# Patient Record
Sex: Male | Born: 1940 | Race: White | Hispanic: No | Marital: Married | Smoking: Never smoker
Health system: Southern US, Community
[De-identification: ages and names within clinical notes are randomized; demographics above are authoritative.]

## PROBLEM LIST (undated history)

## (undated) DIAGNOSIS — M419 Scoliosis, unspecified: Secondary | ICD-10-CM

## (undated) DIAGNOSIS — K141 Geographic tongue: Secondary | ICD-10-CM

## (undated) DIAGNOSIS — I10 Essential (primary) hypertension: Secondary | ICD-10-CM

## (undated) DIAGNOSIS — E785 Hyperlipidemia, unspecified: Secondary | ICD-10-CM

## (undated) DIAGNOSIS — G8929 Other chronic pain: Secondary | ICD-10-CM

## (undated) DIAGNOSIS — M25562 Pain in left knee: Secondary | ICD-10-CM

## (undated) DIAGNOSIS — I700 Atherosclerosis of aorta: Secondary | ICD-10-CM

## (undated) DIAGNOSIS — G2581 Restless legs syndrome: Secondary | ICD-10-CM

## (undated) DIAGNOSIS — Z01810 Encounter for preprocedural cardiovascular examination: Secondary | ICD-10-CM

## (undated) DIAGNOSIS — M25561 Pain in right knee: Secondary | ICD-10-CM

## (undated) DIAGNOSIS — M1990 Unspecified osteoarthritis, unspecified site: Secondary | ICD-10-CM

## (undated) DIAGNOSIS — E1169 Type 2 diabetes mellitus with other specified complication: Secondary | ICD-10-CM

## (undated) DIAGNOSIS — Z889 Allergy status to unspecified drugs, medicaments and biological substances status: Secondary | ICD-10-CM

## (undated) DIAGNOSIS — Z87442 Personal history of urinary calculi: Secondary | ICD-10-CM

## (undated) DIAGNOSIS — K5792 Diverticulitis of intestine, part unspecified, without perforation or abscess without bleeding: Secondary | ICD-10-CM

## (undated) DIAGNOSIS — R011 Cardiac murmur, unspecified: Secondary | ICD-10-CM

## (undated) DIAGNOSIS — M4807 Spinal stenosis, lumbosacral region: Secondary | ICD-10-CM

## (undated) HISTORY — PX: APPENDECTOMY: SHX54

## (undated) HISTORY — DX: Hyperlipidemia, unspecified: E78.5

## (undated) HISTORY — PX: COLONOSCOPY: SHX174

## (undated) HISTORY — PX: KNEE ARTHROSCOPY: SHX127

## (undated) HISTORY — DX: Essential (primary) hypertension: I10

## (undated) HISTORY — PX: ROTATOR CUFF REPAIR: SHX139

## (undated) HISTORY — DX: Pain in right knee: M25.561

## (undated) HISTORY — DX: Other chronic pain: G89.29

## (undated) HISTORY — DX: Type 2 diabetes mellitus with other specified complication: E11.69

## (undated) HISTORY — DX: Unspecified osteoarthritis, unspecified site: M19.90

## (undated) HISTORY — DX: Morbid (severe) obesity due to excess calories: E66.01

## (undated) HISTORY — DX: Encounter for preprocedural cardiovascular examination: Z01.810

## (undated) HISTORY — DX: Atherosclerosis of aorta: I70.0

## (undated) HISTORY — PX: SHOULDER SURGERY: SHX246

## (undated) HISTORY — PX: CHOLECYSTECTOMY: SHX55

## (undated) HISTORY — PX: CYSTOSCOPY: SUR368

## (undated) HISTORY — PX: TONSILLECTOMY: SUR1361

## (undated) HISTORY — PX: OTHER SURGICAL HISTORY: SHX169

## (undated) HISTORY — DX: Pain in left knee: M25.562

---

## 1998-02-10 ENCOUNTER — Ambulatory Visit (HOSPITAL_COMMUNITY): Admission: RE | Admit: 1998-02-10 | Discharge: 1998-02-10 | Payer: Self-pay | Admitting: Gastroenterology

## 1998-02-10 ENCOUNTER — Encounter: Payer: Self-pay | Admitting: Gastroenterology

## 1998-10-13 ENCOUNTER — Encounter (INDEPENDENT_AMBULATORY_CARE_PROVIDER_SITE_OTHER): Payer: Self-pay | Admitting: Specialist

## 1998-10-13 ENCOUNTER — Observation Stay (HOSPITAL_COMMUNITY): Admission: RE | Admit: 1998-10-13 | Discharge: 1998-10-14 | Payer: Self-pay | Admitting: General Surgery

## 1998-10-13 ENCOUNTER — Encounter: Payer: Self-pay | Admitting: General Surgery

## 1998-11-27 ENCOUNTER — Encounter: Payer: Self-pay | Admitting: Emergency Medicine

## 1998-11-27 ENCOUNTER — Inpatient Hospital Stay (HOSPITAL_COMMUNITY): Admission: EM | Admit: 1998-11-27 | Discharge: 1998-12-07 | Payer: Self-pay | Admitting: Emergency Medicine

## 1998-11-30 ENCOUNTER — Encounter: Payer: Self-pay | Admitting: Surgery

## 1998-12-06 ENCOUNTER — Encounter: Payer: Self-pay | Admitting: Surgery

## 1999-01-05 ENCOUNTER — Encounter: Payer: Self-pay | Admitting: Surgery

## 1999-01-05 ENCOUNTER — Encounter: Admission: RE | Admit: 1999-01-05 | Discharge: 1999-01-05 | Payer: Self-pay | Admitting: Surgery

## 2003-02-20 ENCOUNTER — Ambulatory Visit (HOSPITAL_BASED_OUTPATIENT_CLINIC_OR_DEPARTMENT_OTHER): Admission: RE | Admit: 2003-02-20 | Discharge: 2003-02-20 | Payer: Self-pay | Admitting: Internal Medicine

## 2005-11-28 ENCOUNTER — Encounter: Admission: RE | Admit: 2005-11-28 | Discharge: 2005-11-28 | Payer: Self-pay | Admitting: Gastroenterology

## 2005-12-29 ENCOUNTER — Encounter: Admission: RE | Admit: 2005-12-29 | Discharge: 2005-12-29 | Payer: Self-pay | Admitting: Surgery

## 2009-03-09 ENCOUNTER — Encounter: Admission: RE | Admit: 2009-03-09 | Discharge: 2009-03-09 | Payer: Self-pay | Admitting: Internal Medicine

## 2009-03-11 ENCOUNTER — Ambulatory Visit: Payer: Self-pay | Admitting: Vascular Surgery

## 2009-03-23 ENCOUNTER — Ambulatory Visit (HOSPITAL_COMMUNITY): Admission: RE | Admit: 2009-03-23 | Discharge: 2009-03-23 | Payer: Self-pay | Admitting: Neurological Surgery

## 2009-04-21 ENCOUNTER — Encounter: Admission: RE | Admit: 2009-04-21 | Discharge: 2009-04-21 | Payer: Self-pay | Admitting: Neurological Surgery

## 2009-05-04 ENCOUNTER — Ambulatory Visit (HOSPITAL_COMMUNITY): Admission: RE | Admit: 2009-05-04 | Discharge: 2009-05-04 | Payer: Self-pay | Admitting: Neurological Surgery

## 2010-01-23 ENCOUNTER — Encounter: Payer: Self-pay | Admitting: Internal Medicine

## 2010-01-23 ENCOUNTER — Encounter: Payer: Self-pay | Admitting: Neurological Surgery

## 2010-05-17 NOTE — Procedures (Signed)
CAROTID DUPLEX EXAM   INDICATION:  Syncope/vertigo, status post fall.   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  Amaurosis Fugax No, Paresthesias No, Hemiparesis No                                       RIGHT             LEFT  Brachial systolic pressure:         162               160  Brachial Doppler waveforms:         Normal            Normal  Vertebral direction of flow:        Antegrade         Antegrade  DUPLEX VELOCITIES (cm/sec)  CCA peak systolic                   92                83  ECA peak systolic                   129               156  ICA peak systolic                   95                54  ICA end diastolic                   17                17  PLAQUE MORPHOLOGY:                  Mixed             Mixed  PLAQUE AMOUNT:                      Mild              Mild  PLAQUE LOCATION:                    ICA               ICA   IMPRESSION:  1. A 1% to 39% stenosis of the bilateral internal carotid arteries.  2. A preliminary report was faxed to Dr. Lanell Matar office on      03/11/2009.         ___________________________________________  Di Kindle. Edilia Bo, M.D.   CH/MEDQ  D:  03/15/2009  T:  03/15/2009  Job:  [id number]

## 2010-05-20 NOTE — H&P (Signed)
[place]  Patient:    Victor [name]                     MRN: [id number] Adm. Date:  [id number] Attending:  Bonnetta Barry CC:         Florencia Reasons, M.D.                         History and Physical  REASON FOR ADMISSION:  Acute diverticulitis.  ADMISSION HISTORY:  The patient is a 70 year old white male who presents to the  Emergency Department of [facility] with three-day history of lower abdominal pain associated with fever.  The patient had recently undergone laparoscopic cholecystectomy.  He had done well and returned to work.  Over the  past two weeks he had noted intermittent lower abdominal pain.  Over the past three days prior to admission the pain had become significantly more severe and localized to the lower midline and left lower quadrant.  The patient had noted progressive constipation.  He presents to the emergency room for evaluation.  The patient was seen by the emergency room physician, laboratory studies were obtained.  This showed an elevated white blood cell count of 19,000 with a left shift of 91% neutrophils.  CT scan of the abdomen was obtained and demonstrates sigmoid diverticulitis without abscess.  There was a small amount of extraluminal air in the peritoneal cavity.  General surgery was consulted for evaluation and management.  PAST MEDICAL HISTORY:  Status post bilateral rotator cuff repairs, status post laparoscopic cholecystectomy, history of degenerative disk disease of the lumbar spine.  CURRENT MEDICATIONS:  Vioxx 25 mg q.d.  ALLERGIES:  None known.  SOCIAL HISTORY:  The patient works in an Warehouse manager.  He does ot smoke.  He does not drink alcohol.  He denies illicit drug use.  FAMILY HISTORY:  Notable for coronary artery disease in the patients mother and  colon cancer in the patients father.  REVIEW OF SYSTEMS:  A 15-point Systems Review is otherwise  unremarkable except s noted above.  PHYSICAL EXAMINATION:  VITAL SIGNS:  Temperature of 101.2, pulse 94, blood pressure 110/75, respirations 18, O2 saturation 96%.  GENERAL APPEARANCE:  A 70 year old well-developed, well-nourished white male on a stretcher in the emergency department.  HEENT:  Normocephalic and atraumatic.  Sclerae are clear.  Mucous membranes are  moist.  NECK:  Supple without masses.  CHEST:  Clear to auscultation bilaterally.  There is no costovertebral angle tenderness.  CARDIOVASCULAR:  Regular rate and rhythm without murmur.  Peripheral pulses are  full.  ABDOMEN:  Moderately distended.  There are healing surgical wounds at the umbilicus and along the right costal margin.  On palpation there is tenderness mainly in he lower quadrants, but especially in the suprapubic and left lower quadrant. There is mild tenderness to percussion in the left lower quadrant.  There is mild rebound tenderness in the left lower quadrant.  EXTREMITIES:  Nontender without edema.  NEUROLOGIC:  The patient is alert and oriented to person, place, and time without focal neurologic deficit.  GENITOURINARY:  Normal male.  LABORATORY AND X-RAY DATA:  CBC shows white count 19.7, hemoglobin 15.2, hematocrit 44.1, platelet count 253,000.  Differential shows 91% neutrophils, 4% lymphocytes, 4% monocytes.  Chemistry profile is notable for the following abnormalities: Total bilirubin 1.4.  Urinalysis is benign.  Abdominal CT scan:  As described above.  IMPRESSION:  Acute diverticulitis.  PLAN:  The patient will be admitted to my service at [facility].  This is his first episode of acute diverticulitis.  He will be placed at bowel rest with intravenous hydration and started on intravenous antibiotics with Unasyn.  We will assess his progress over the next 24-48 hours and advance his diet after that interval.  I discussed the possibility of complications  including bleeding, perforation, or abscess with the patient and his wife.  I also discussed the possibility of need for sigmoid resection in the future.  They understand. DD:  11/27/98 TD:  11/27/98 Job: [id number]

## 2010-12-07 IMAGING — RF DG MYELOGRAM LUMBAR
11 series · 11 of 11 positions shown · non-contrast
Comparison: None.

05/05/2009 - DUPLICATE COPY for exam association in [facility] – No change from original report.
CLINICAL DATA: Back pain.  
 LUMBAR MYELOGRAM AND POST MYELOGRAM CT

 MYELOGRAM LUMBAR
TECHNIQUE: Contrast was injected by Dr.Dr. Kenichiro at the L1-2
 level. Contrast was negotiated into the lumbar spine without
 difficulty.
 Fluoroscopy time: 1.4 minutes
TECHNIQUE: Multidetector CT imaging of the lumbar spine was
 performed following myelography. Multiplanar CT image
 reconstructions were also generated.

[Series 1: run · 1 of 1 slices shown (1 of 11)]
[im 1/1]
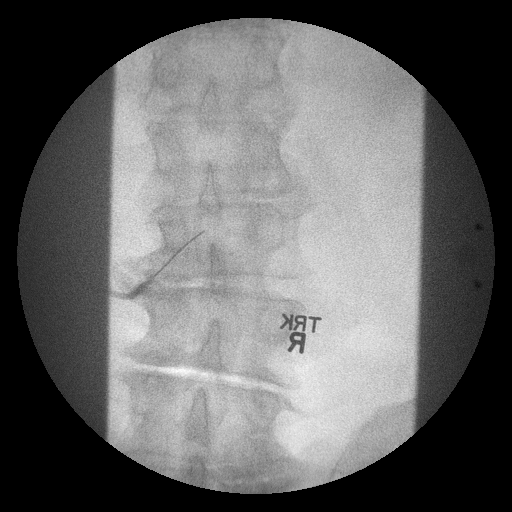

[Series 2: run · 1 of 1 slices shown (2 of 11)]
[im 1/1]
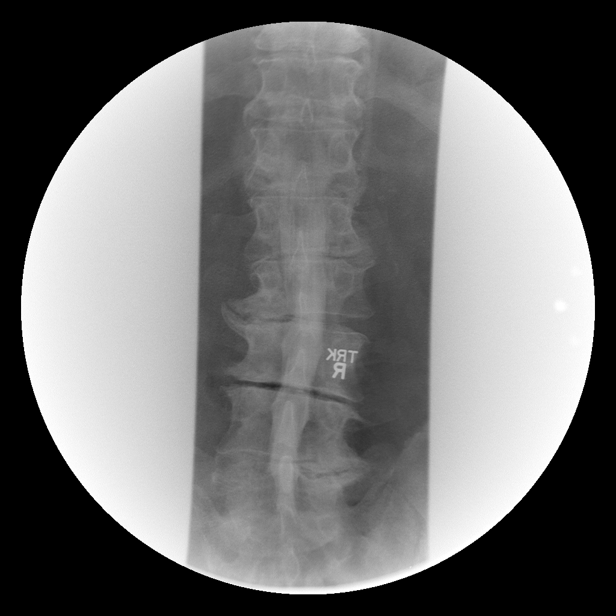

[Series 3: run · 1 of 1 slices shown (3 of 11)]
[im 1/1]
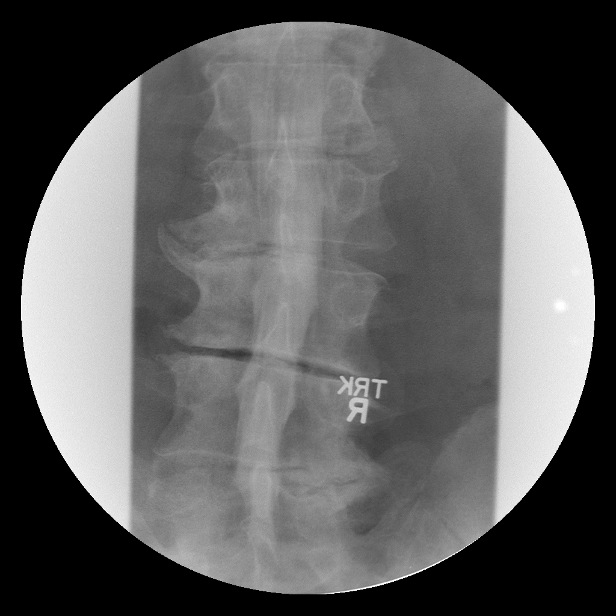

[Series 4: run · 1 of 1 slices shown (4 of 11)]
[im 1/1]
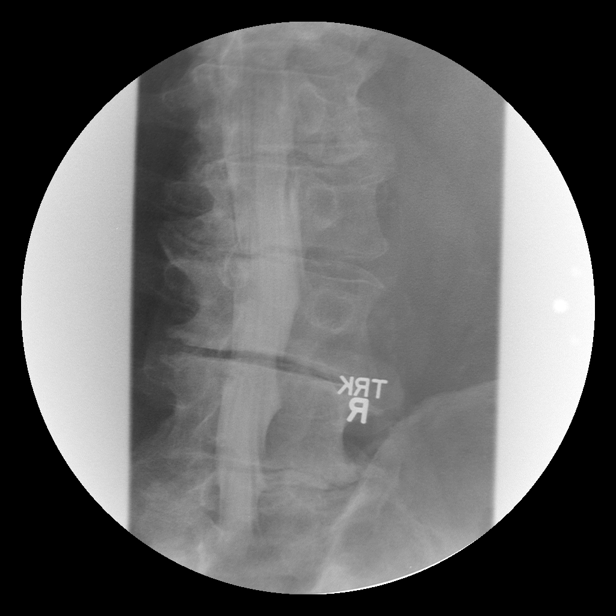

[Series 5: run · 1 of 1 slices shown (5 of 11)]
[im 1/1]
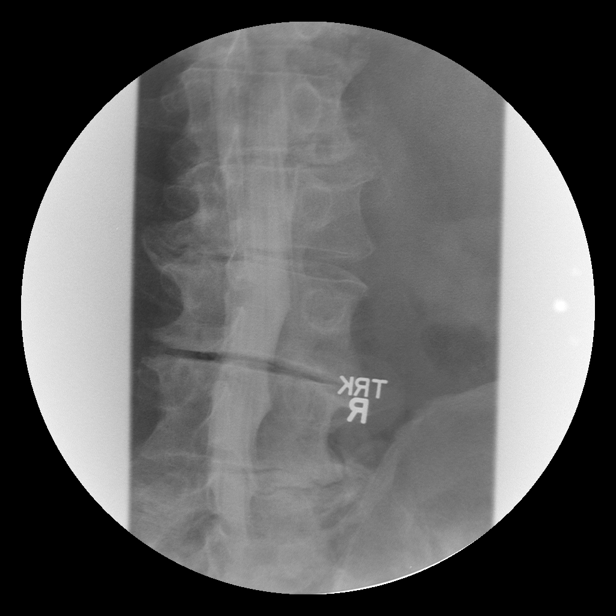

[Series 6: run · 1 of 1 slices shown (6 of 11)]
[im 1/1]
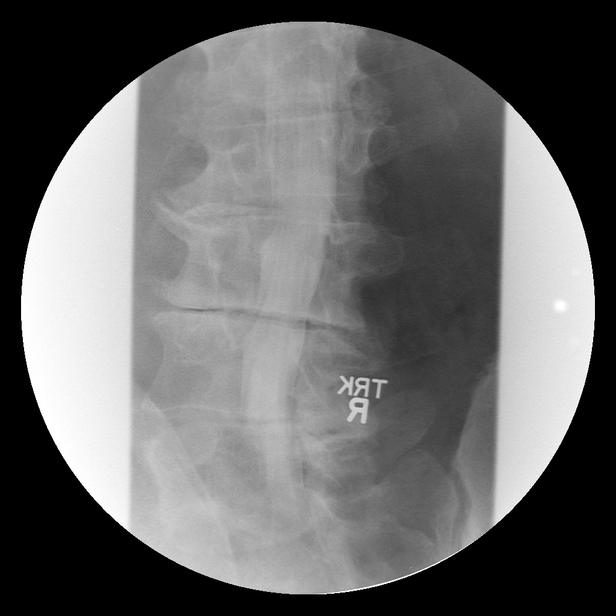

[Series 7: run · 1 of 1 slices shown (7 of 11)]
[im 1/1]
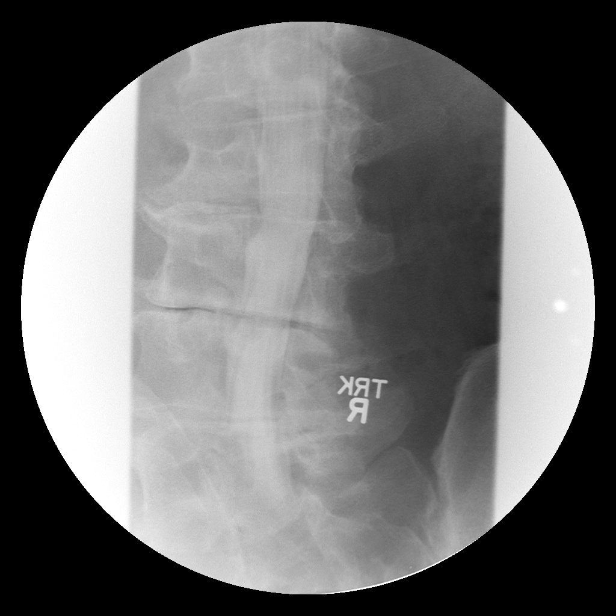

[Series 8: run · 1 of 1 slices shown (8 of 11)]
[im 1/1]
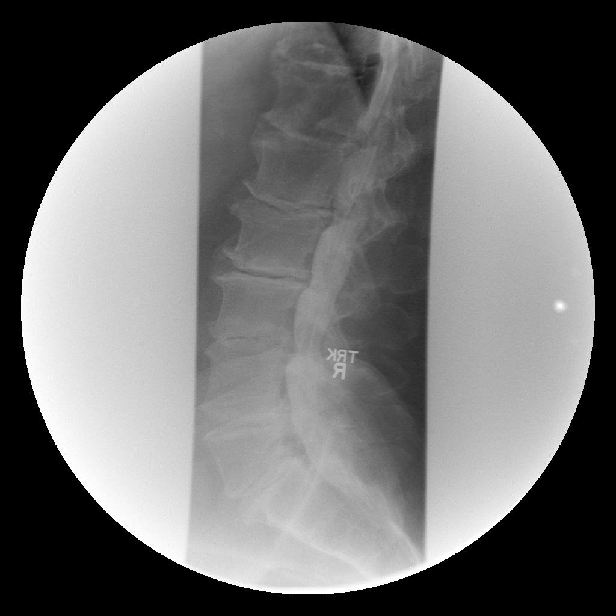

[Series 9: run · 1 of 1 slices shown (9 of 11)]
[im 1/1]
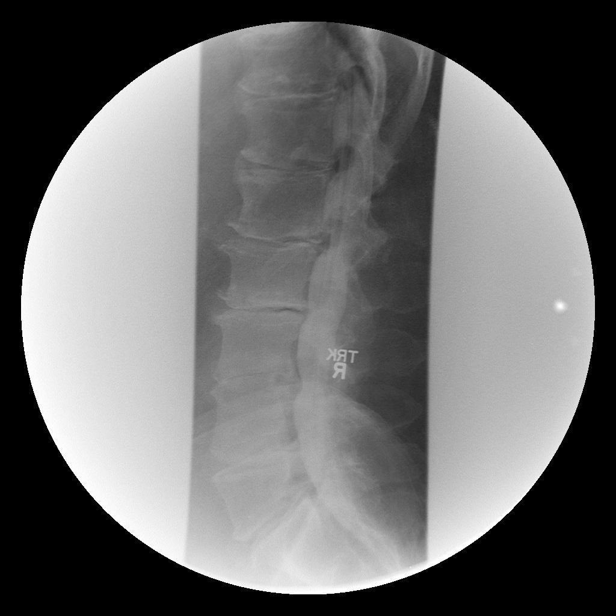

[Series 10: run · 1 of 1 slices shown (10 of 11)]
[im 1/1]
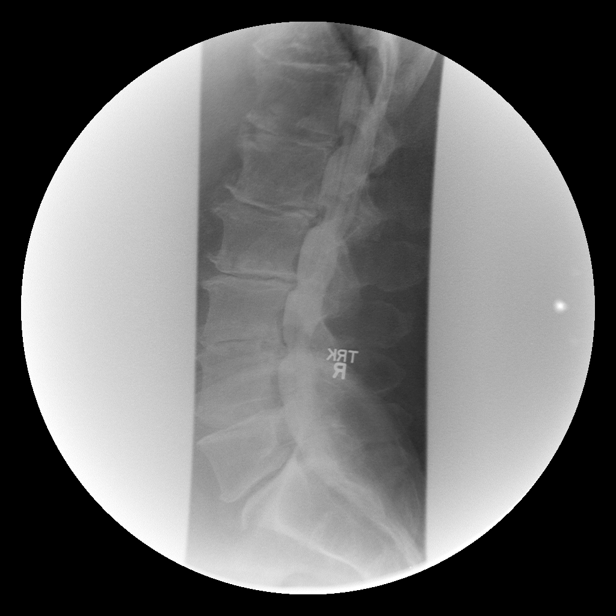

[Series 11: run · 1 of 1 slices shown (11 of 11)]
[im 1/1]
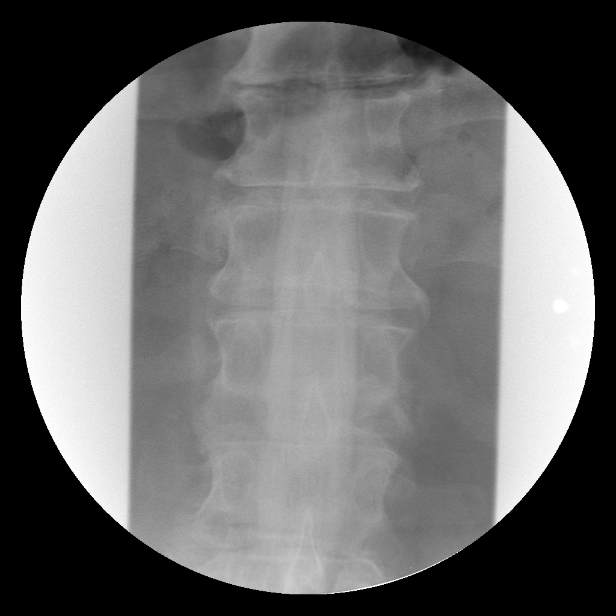

[11 of 11 positions shown; findings below may reference images not displayed]

FINDINGS: Mild dextroscoliosis centered at the L2-3 level. Left
 lateral osteophyte L2-3 level with impression on the left ventral
 aspect of the thecal sac and left L3 nerve root.

 Ventral impression upon the thecal sac T12-L1 through L3-4. No
 abnormal motion between flexion and extension.
IMPRESSION: Mild dextroscoliosis centered at the L2-3 level.

 Left lateral osteophyte L2-3 level with impression on the left
 ventral aspect of the thecal sac and left L3 nerve root.

 Ventral impression upon the thecal sac T12-L1 through L3-4.

 No abnormal motion between flexion and extension.

 CT MYELOGRAPHY LUMBAR SPINE
FINDINGS: Last fully open disc space is labeled L5-S1. Present
 examination incorporates from T12 through lower sacrum. Conus L1-
 2.

 Aortic calcifications and iliac artery calcifications with ectasia.
 Bilateral renal cyst suspected larger on the left incompletely
 evaluated present exam. Left renal nonobstructing 4.4 mm stone.

 Dextroscoliosis lumbar spine. Bilateral sacroiliac joint
 degenerative changes.

 T12-L1: Schmorl's node deformity. Mild retrolisthesis T12 with
 rotation. Mild bulge. Very mild spinal stenosis.

 L1-2: Moderate disc degeneration. Mild retrolisthesis L1 with
 rotation. Mild to moderate bulge. Spur. Very mild spinal
 stenosis and bilateral foraminal narrowing.

 L2-3: Marked disc degeneration with disc space narrowing and
 osteophyte formation left lateral position encroaching upon the
 exiting left L2 nerve root. Mild left lateral recess stenosis.

 L3-4: Moderate disc degeneration with bulge and broad-based
 osteophyte rotation. Left lateral bulge/osteophyte with slight
 encroaching upon the exiting left L3 nerve root. Very mild spinal
 stenosis. Posterior element hypertrophy with slight impression
 upon the adjacent thecal sac.

 L4-5: Marked disc degeneration with broad-based osteophyte greater
 right lateral position and with slight encroachment upon the
 exiting right L4 nerve root. Bulge without significant spinal
 stenosis.

 L5-S1: Bilateral pars defects with bony overgrowth. Disc
 degeneration with broad-based disc osteophyte complex extending
 into the narrowed neural foramen bilaterally with slight
 encroachment upon the exiting L5 nerve roots bilaterally which do
 not appear to be significantly compressed. No significant spinal
 stenosis.
IMPRESSION: Bilateral L5 pars defects with bilateral L5-S1 foraminal narrowing
 causing slight encroachment upon, but not significant compression,
 of the exiting L5 nerve root.

 Left lateral osteophyte L2-3 and L3-4 level causes slight
 encroaching upon the exiting left L2 and left L3 nerve root
 respectively.

 Mild left L2-3 lateral recess stenosis.

 L4-5 right lateral disc osteophyte with slight encroach upon the
 exiting right L4 nerve root.

 Each of the disc from the T12-L1 through L5 S level are
 degenerated. There is slight ventral impression upon the thecal
 sac but without significant spinal stenosis.

## 2012-05-31 ENCOUNTER — Other Ambulatory Visit (HOSPITAL_COMMUNITY): Payer: Self-pay | Admitting: Internal Medicine

## 2012-05-31 ENCOUNTER — Ambulatory Visit (HOSPITAL_COMMUNITY)
Admission: RE | Admit: 2012-05-31 | Discharge: 2012-05-31 | Disposition: A | Discharge status: Home / Self Care | Payer: Federal, State, Local not specified - PPO | Source: Ambulatory Visit | Attending: Internal Medicine | Admitting: Internal Medicine

## 2012-05-31 DIAGNOSIS — K5732 Diverticulitis of large intestine without perforation or abscess without bleeding: Secondary | ICD-10-CM | POA: Insufficient documentation

## 2012-05-31 DIAGNOSIS — R1900 Intra-abdominal and pelvic swelling, mass and lump, unspecified site: Secondary | ICD-10-CM

## 2012-05-31 DIAGNOSIS — I7090 Unspecified atherosclerosis: Secondary | ICD-10-CM | POA: Insufficient documentation

## 2012-05-31 DIAGNOSIS — K630 Abscess of intestine: Secondary | ICD-10-CM | POA: Insufficient documentation

## 2012-05-31 DIAGNOSIS — Z9089 Acquired absence of other organs: Secondary | ICD-10-CM | POA: Insufficient documentation

## 2012-05-31 DIAGNOSIS — N281 Cyst of kidney, acquired: Secondary | ICD-10-CM | POA: Insufficient documentation

## 2012-05-31 MED ORDER — IOHEXOL 300 MG/ML  SOLN: 50.0000 mL | Freq: Once | INTRAMUSCULAR | Status: AC | PRN

## 2012-06-03 ENCOUNTER — Telehealth (INDEPENDENT_AMBULATORY_CARE_PROVIDER_SITE_OTHER): Payer: Self-pay

## 2012-06-03 NOTE — Telephone Encounter (Signed)
Per Dr Ardine Eng request pt given appt for 6-24. Pt aware.  Old chart ordered.

## 2012-06-25 ENCOUNTER — Ambulatory Visit (INDEPENDENT_AMBULATORY_CARE_PROVIDER_SITE_OTHER): Payer: Federal, State, Local not specified - PPO | Admitting: Surgery

## 2012-06-25 ENCOUNTER — Encounter (INDEPENDENT_AMBULATORY_CARE_PROVIDER_SITE_OTHER): Payer: Self-pay | Admitting: Surgery

## 2012-06-25 VITALS — BP 142/70 | HR 68 | Temp 97.3°F | Resp 12 | Ht 68.5 in | Wt 255.0 lb

## 2012-06-25 DIAGNOSIS — K5730 Diverticulosis of large intestine without perforation or abscess without bleeding: Secondary | ICD-10-CM | POA: Insufficient documentation

## 2012-06-25 HISTORY — DX: Diverticulosis of large intestine without perforation or abscess without bleeding: K57.30

## 2012-06-25 NOTE — Progress Notes (Signed)
2 day colon prep with antibiotics attached given to pt.

## 2012-06-25 NOTE — Progress Notes (Signed)
General Surgery [facility]  Chief Complaint  Patient presents with  . New Evaluation    diverticular disease - referral from Dr. Geoffry Paradise    HISTORY: Patient is a 72 year old male referred by his primary care physician for management of long-standing sigmoid diverticular disease. I first saw this patient in 2000 when he was admitted with acute diverticulitis with microperforation. He resolved with medical management. Patient has had 3 additional episodes of acute diverticulitis since that time. His most recent episode of acute diverticulitis was the spring. This was documented on sequential CT scanning. Patient was treated with oral antibiotics and bowel rest. Antibiotic therapy was completed a little over one week ago. He now presents at the request of his primary care physician for evaluation for resection of the involved segment of the colon.  Recent CT scan is reviewed. Acute diverticulitis involving the sigmoid colon is noted. There is either a large diverticulum or contained perforation also noted. There is adherence of the sigmoid colon to the dome of the bladder. Patient denies any signs or symptoms of fistula formation.  Patient has had screening colonoscopy on a regular basis by Dr. Bernette Redbird. His most recent study was a little over 2 years ago.  Past Medical History  Diagnosis Date  . Arthritis   . Hyperlipidemia      Current Outpatient Prescriptions  Medication Sig Dispense Refill  . acetaminophen (TYLENOL) 500 MG tablet Take 500 mg by mouth every 6 (six) hours as needed for pain.      . meloxicam (MOBIC) 15 MG tablet       . simvastatin (ZOCOR) 40 MG tablet       . latanoprost (XALATAN) 0.005 % ophthalmic solution        No current facility-administered medications for this visit.     No Known Allergies   Family History  Problem Relation Age of Onset  . Heart disease Mother   . Cancer Father     colon & lung  . Cancer Maternal  Grandfather     bladder     History   Social History  . Marital Status: Married    Spouse Name: N/A    Number of Children: N/A  . Years of Education: N/A   Social History Main Topics  . Smoking status: Never Smoker   . Smokeless tobacco: Never Used  . Alcohol Use: No  . Drug Use: No  . Sexually Active: None   Other Topics Concern  . None   Social History Narrative  . None     REVIEW OF SYSTEMS - PERTINENT POSITIVES ONLY: Mild intermittent fecal incontinence. Intermittent mild abdominal pain. Denies fever. Denies chills. Denies bleeding parenchyma.  EXAM: Filed Vitals:   06/25/12 0953  BP: 142/70  Pulse: 68  Temp: 97.3 F (36.3 C)  Resp: 12    HEENT: normocephalic; pupils equal and reactive; sclerae clear; dentition good; mucous membranes moist NECK:  No palpable masses in the thyroid bed; symmetric on extension; no palpable anterior or posterior cervical lymphadenopathy; no supraclavicular masses; no tenderness CHEST: clear to auscultation bilaterally without rales, rhonchi, or wheezes CARDIAC: regular rate and rhythm without significant murmur; peripheral pulses are full ABDOMEN: soft without distension; bowel sounds present; no mass; no hepatosplenomegaly; hernia at the level of umbilicus, likely incisional; mild tenderness to deep palpation in both the left and right lower quadrants without peritoneal signs EXT:  non-tender without edema; no deformity NEURO: no gross focal deficits; no sign of  tremor   LABORATORY RESULTS: See [facility] (CHL-Epic) for most recent results   RADIOLOGY RESULTS: See [facility] (CHL-Epic) for most recent results   IMPRESSION: #1 history of recurrent episodes of acute diverticulitis #2 incisional hernia at umbilicus #3 history of laparoscopic cholecystectomy #4 mild fecal incontinence  PLAN: I had a lengthy discussion with the patient and his wife regarding diverticular disease and its surgical management. I  provided them with written literature to review. We have reviewed his most recent CT scans.  At this point I do not think the patient requires further diagnostic studies prior to surgery. I have recommended exploratory laparotomy and sigmoid colectomy. I have explained to him that if there is residual infection he will require a temporary colostomy. I think the risk of this is approximately 10%.  We discussed sigmoid colectomy. We discussed the hospital stay to be anticipated. We discussed potential complications including infection. We discussed the change in his bowel habits to be expected following the procedure. Patient understands and wishes to proceed.  Following surgery and his recovery, we will evaluate his mild fecal incontinence. If it persists, I may ask him to see a colorectal specialist here in my practice.  The risks and benefits of the procedure have been discussed at length with the patient.  The patient understands the proposed procedure, potential alternative treatments, and the course of recovery to be expected.  All of the patient's questions have been answered at this time.  The patient wishes to proceed with surgery.  Velora Heckler, MD, FACS General & Endocrine Surgery [facility]   Visit Diagnoses: 1. Diverticulosis of colon without hemorrhage     Primary Care Physician: Minda Meo, MD

## 2012-06-25 NOTE — Patient Instructions (Signed)
[facility]  OPEN ABDOMINAL SURGERY: POST OP INSTRUCTIONS  Always review your discharge instruction sheet given to you by the facility where your surgery was performed.  1. A prescription for pain medication may be given to you upon discharge.  Take your pain medication as prescribed.  If narcotic pain medicine is not needed, then you may take acetaminophen (Tylenol) or ibuprofen (Advil) as needed. 2. Take your usually prescribed medications unless otherwise directed. 3. If you need a refill on your pain medication, please contact your pharmacy. They will contact our office to request authorization.  Prescriptions will not be filled after 5 pm or on weekends. 4. You should follow a light diet the first few days after arrival home, such as soup and crackers, unless your doctor has advised otherwise. A high-fiber, low fat diet can be resumed as tolerated.  Be sure to include plenty of fluids daily.  5. Most patients will experience some swelling and bruising in the area of the incision. Ice packs will help. Swelling and bruising can take several days to resolve. 6. It is common to experience some constipation if taking pain medication after surgery.  Increasing fluid intake and taking a stool softener will usually help or prevent this problem from occurring.  A mild laxative (Milk of Magnesia or Miralax) should be taken according to package directions if there are no bowel movements after 48 hours. 7.  You may have steri-strips (small skin tapes) in place directly over the incision.  These strips should be left on the skin for 7-10 days.  If your surgeon used skin glue on the incision, you may shower in 24 hours.  The glue will flake off over the next 2-3 weeks.  Any sutures or staples will be removed at the office during your follow-up visit. You may find that a light gauze bandage over your incision may keep your staples from being rubbed or pulled. You may shower and replace the bandage  daily. 8. ACTIVITIES:  You may resume regular (light) daily activities beginning the next day-such as daily self-care, walking, climbing stairs-gradually increasing activities as tolerated.  You may have sexual intercourse when it is comfortable.  Refrain from any heavy lifting or straining until approved by your doctor.  You may drive when you no longer are taking prescription pain medication, you can comfortably wear a seatbelt, and you can safely maneuver your car and apply brakes. 9. You should see your doctor in the office for a follow-up appointment approximately two weeks after your surgery.  Make sure that you call for this appointment within a day or two after you arrive home to insure a convenient appointment time.  WHEN TO CALL YOUR DOCTOR: 1. Fever greater than 101.0 2. Inability to urinate 3. Persistent nausea and/or vomiting 4. Extreme swelling or bruising 5. Continued bleeding from incision 6. Increased pain, redness, or drainage from the incision 7. Difficulty swallowing or breathing 8. Muscle cramping or spasms 9. Numbness or tingling in hands or around lips  IF YOU HAVE DISABILITY OR FAMILY LEAVE FORMS, YOU MUST BRING THEM TO THE OFFICE FOR PROCESSING.  PLEASE DO NOT GIVE THEM TO YOUR DOCTOR.  The clinic staff is available to answer your questions during regular business hours.  Please don't hesitate to call and ask to speak to one of the nurses if you have concerns.  [facility] Office: [phone]  For further questions, please visit [email or web address]   

## 2012-08-01 ENCOUNTER — Encounter (HOSPITAL_COMMUNITY): Payer: Self-pay | Admitting: Pharmacy Technician

## 2012-08-06 ENCOUNTER — Other Ambulatory Visit (HOSPITAL_COMMUNITY): Payer: Self-pay | Admitting: Surgery

## 2012-08-06 ENCOUNTER — Encounter (HOSPITAL_COMMUNITY)
Admission: RE | Admit: 2012-08-06 | Discharge: 2012-08-06 | Disposition: A | Discharge status: Home / Self Care | Payer: Federal, State, Local not specified - PPO | Source: Ambulatory Visit | Attending: Surgery | Admitting: Surgery

## 2012-08-06 ENCOUNTER — Encounter (HOSPITAL_COMMUNITY): Payer: Self-pay

## 2012-08-06 DIAGNOSIS — K5732 Diverticulitis of large intestine without perforation or abscess without bleeding: Secondary | ICD-10-CM | POA: Insufficient documentation

## 2012-08-06 DIAGNOSIS — Z01812 Encounter for preprocedural laboratory examination: Secondary | ICD-10-CM | POA: Insufficient documentation

## 2012-08-06 HISTORY — DX: Scoliosis, unspecified: M41.9

## 2012-08-06 HISTORY — DX: Allergy status to unspecified drugs, medicaments and biological substances: Z88.9

## 2012-08-06 HISTORY — DX: Spinal stenosis, lumbosacral region: M48.07

## 2012-08-06 HISTORY — DX: Restless legs syndrome: G25.81

## 2012-08-06 HISTORY — DX: Geographic tongue: K14.1

## 2012-08-06 HISTORY — DX: Diverticulitis of intestine, part unspecified, without perforation or abscess without bleeding: K57.92

## 2012-08-06 LAB — CBC
HCT: 46.6 % (ref 39.0–52.0)
Hemoglobin: 15.8 g/dL (ref 13.0–17.0)
MCH: 30.8 pg (ref 26.0–34.0)
MCHC: 33.9 g/dL (ref 30.0–36.0)
Platelets: 210 10*3/uL (ref 150–400)
RBC: 5.13 MIL/uL (ref 4.22–5.81)
WBC: 11.7 10*3/uL — ABNORMAL HIGH (ref 4.0–10.5)

## 2012-08-06 NOTE — Progress Notes (Signed)
Pt has order for anesthesia consult. Spoke with Dr. Smith Mince and they will see him on day of surgery.

## 2012-08-06 NOTE — Progress Notes (Signed)
EKG 05/16/12 on chart, LOV note 07/31/12 Dr. Jacky Kindle on chart, CT abdomen 05/31/12 on EPIC

## 2012-08-06 NOTE — Patient Instructions (Addendum)
20 ANAIS KOENEN  08/06/2012   Your procedure is scheduled on: 08-12-2012  Report to [facility] at 715 AM  Call this number if you have problems the morning of surgery [phone]   Remember:FOLLOW TWO DAY BOWEL PREP INSTRUCTIONS PER DR GERKIN'S OFFICE, NO CLEAR LIQUIDS AFTER MIDNIGHT THE NIGHT BEFORE SURGERY                     Do not wear jewelry, make-up or nail polish.  Do not wear lotions, powders, or perfumes. You may wear deodorant.   Men may shave face and neck.  Do not bring valuables to the hospital. [facility] IS NOT RESPONSIBLE FOR VALUEABLES.  Contacts, dentures or bridgework may not be worn into surgery.  Leave suitcase in the car. After surgery it may be brought to your room.  For patients admitted to the hospital, checkout time is 11:00 AM the day of discharge.    Please read over the following fact sheets that you were given: MRSA Information, BLOOD FACT SHEET, INCENTIVE SPIROMETER FACT SHEET  Call Birdie Sons RN pre op nurse if needed [phone]    FAILURE TO FOLLOW THESE INSTRUCTIONS MAY RESULT IN THE CANCELLATION OF YOUR SURGERY.  PATIENT SIGNATURE___________________________________________  NURSE SIGNATURE_____________________________________________

## 2012-08-06 NOTE — Progress Notes (Signed)
08/06/12 1156  OBSTRUCTIVE SLEEP APNEA  Have you ever been diagnosed with sleep apnea through a sleep study? No  Do you snore loudly (loud enough to be heard through closed doors)?  1  Do you often feel tired, fatigued, or sleepy during the daytime? 1  Has anyone observed you stop breathing during your sleep? 0  Do you have, or are you being treated for high blood pressure? 0  BMI more than 35 kg/m2? 1  Age over 71 years old? 1  Neck circumference greater than 40 cm/18 inches? 0  Gender: 1  Obstructive Sleep Apnea Score 5  Score 4 or greater  Results sent to PCP

## 2012-08-12 ENCOUNTER — Encounter (HOSPITAL_COMMUNITY): Payer: Self-pay | Admitting: Anesthesiology

## 2012-08-12 ENCOUNTER — Inpatient Hospital Stay (HOSPITAL_COMMUNITY)
Admission: RE | Admit: 2012-08-12 | Discharge: 2012-08-17 | DRG: 330 | Disposition: A | Discharge status: Home / Self Care | Payer: Medicare Other | Source: Ambulatory Visit | Attending: Surgery | Admitting: Surgery

## 2012-08-12 ENCOUNTER — Encounter (HOSPITAL_COMMUNITY): Payer: Self-pay | Admitting: *Deleted

## 2012-08-12 ENCOUNTER — Encounter (HOSPITAL_COMMUNITY)
Admission: RE | Disposition: A | Discharge status: Home / Self Care | Payer: Self-pay | Source: Ambulatory Visit | Attending: Surgery

## 2012-08-12 ENCOUNTER — Inpatient Hospital Stay (HOSPITAL_COMMUNITY): Payer: Medicare Other | Admitting: Anesthesiology

## 2012-08-12 DIAGNOSIS — K432 Incisional hernia without obstruction or gangrene: Secondary | ICD-10-CM | POA: Diagnosis present

## 2012-08-12 DIAGNOSIS — K5732 Diverticulitis of large intestine without perforation or abscess without bleeding: Principal | ICD-10-CM | POA: Diagnosis present

## 2012-08-12 DIAGNOSIS — K36 Other appendicitis: Secondary | ICD-10-CM | POA: Diagnosis present

## 2012-08-12 DIAGNOSIS — K5730 Diverticulosis of large intestine without perforation or abscess without bleeding: Secondary | ICD-10-CM | POA: Diagnosis present

## 2012-08-12 DIAGNOSIS — K630 Abscess of intestine: Secondary | ICD-10-CM

## 2012-08-12 DIAGNOSIS — K560 Paralytic ileus: Secondary | ICD-10-CM | POA: Diagnosis not present

## 2012-08-12 DIAGNOSIS — Z01812 Encounter for preprocedural laboratory examination: Secondary | ICD-10-CM

## 2012-08-12 HISTORY — PX: APPENDECTOMY: SHX54

## 2012-08-12 HISTORY — PX: PARTIAL COLECTOMY: SHX5273

## 2012-08-12 LAB — CREATININE, SERUM
Creatinine, Ser: 0.71 mg/dL (ref 0.50–1.35)
GFR calc Af Amer: >90 mL/min (ref >90)
GFR calc non Af Amer: >90 mL/min (ref >90)

## 2012-08-12 LAB — CBC
HCT: 44.5 % (ref 39.0–52.0)
MCHC: 34.2 g/dL (ref 30.0–36.0)
Platelets: 240 10*3/uL (ref 150–400)
RDW: 13.7 % (ref 11.5–15.5)
WBC: 23.2 10*3/uL — ABNORMAL HIGH (ref 4.0–10.5)

## 2012-08-12 SURGERY — COLECTOMY, PARTIAL
Anesthesia: General | Wound class: Contaminated

## 2012-08-12 MED ORDER — PROPOFOL 10 MG/ML IV BOLUS
INTRAVENOUS | Status: DC | PRN
  Administered 2012-08-12: 50 mg via INTRAVENOUS
  Administered 2012-08-12: 20 mg via INTRAVENOUS
  Administered 2012-08-12: 180 mg via INTRAVENOUS

## 2012-08-12 MED ORDER — LIDOCAINE HCL 1 % IJ SOLN
INTRAMUSCULAR | Status: DC | PRN
  Administered 2012-08-12: 100 mg via INTRADERMAL

## 2012-08-12 MED ORDER — KETOROLAC TROMETHAMINE 30 MG/ML IJ SOLN: 15.0000 mg | Freq: Once | INTRAMUSCULAR | Status: DC | PRN

## 2012-08-12 MED ORDER — HYDRALAZINE HCL 20 MG/ML IJ SOLN
INTRAMUSCULAR | Status: DC | PRN
  Administered 2012-08-12 (×3): 5 mg via INTRAVENOUS

## 2012-08-12 MED ORDER — SODIUM CHLORIDE 0.9 % IV SOLN
1.0000 g | INTRAVENOUS | Status: AC
  Administered 2012-08-12: 1 g via INTRAVENOUS
  Filled 2012-08-12: qty 1

## 2012-08-12 MED ORDER — ONDANSETRON HCL 4 MG/2ML IJ SOLN: 4.0000 mg | Freq: Four times a day (QID) | INTRAMUSCULAR | Status: DC | PRN

## 2012-08-12 MED ORDER — ONDANSETRON HCL 4 MG/2ML IJ SOLN
4.0000 mg | Freq: Four times a day (QID) | INTRAMUSCULAR | Status: DC | PRN
  Administered 2012-08-14: 4 mg via INTRAVENOUS
  Filled 2012-08-12: qty 2

## 2012-08-12 MED ORDER — LABETALOL HCL 5 MG/ML IV SOLN
INTRAVENOUS | Status: DC | PRN
  Administered 2012-08-12 (×4): 5 mg via INTRAVENOUS

## 2012-08-12 MED ORDER — HYDROMORPHONE HCL PF 1 MG/ML IJ SOLN
INTRAMUSCULAR | Status: DC | PRN
  Administered 2012-08-12 (×2): 1 mg via INTRAVENOUS

## 2012-08-12 MED ORDER — HYDROMORPHONE HCL PF 1 MG/ML IJ SOLN
0.2500 mg | INTRAMUSCULAR | Status: DC | PRN
  Administered 2012-08-12 (×4): 0.5 mg via INTRAVENOUS

## 2012-08-12 MED ORDER — ONDANSETRON HCL 4 MG PO TABS: 4.0000 mg | ORAL_TABLET | Freq: Four times a day (QID) | ORAL | Status: DC | PRN

## 2012-08-12 MED ORDER — DIPHENHYDRAMINE HCL 50 MG/ML IJ SOLN
12.5000 mg | Freq: Four times a day (QID) | INTRAMUSCULAR | Status: DC | PRN
  Administered 2012-08-12 – 2012-08-13 (×4): 12.5 mg via INTRAVENOUS
  Filled 2012-08-12 (×4): qty 1

## 2012-08-12 MED ORDER — LACTATED RINGERS IV SOLN: INTRAVENOUS | Status: DC

## 2012-08-12 MED ORDER — GLYCOPYRROLATE 0.2 MG/ML IJ SOLN
INTRAMUSCULAR | Status: DC | PRN
  Administered 2012-08-12: .8 mg via INTRAVENOUS

## 2012-08-12 MED ORDER — NALOXONE HCL 0.4 MG/ML IJ SOLN: 0.4000 mg | INTRAMUSCULAR | Status: DC | PRN

## 2012-08-12 MED ORDER — PROMETHAZINE HCL 25 MG/ML IJ SOLN: 6.2500 mg | INTRAMUSCULAR | Status: DC | PRN

## 2012-08-12 MED ORDER — KCL IN DEXTROSE-NACL 30-5-0.45 MEQ/L-%-% IV SOLN
INTRAVENOUS | Status: DC
  Administered 2012-08-12 (×2): via INTRAVENOUS
  Administered 2012-08-13: 100 mL via INTRAVENOUS
  Administered 2012-08-13: 09:00:00 via INTRAVENOUS
  Administered 2012-08-14: 50 mL/h via INTRAVENOUS
  Administered 2012-08-14: 100 mL via INTRAVENOUS
  Administered 2012-08-16: 15:00:00 via INTRAVENOUS
  Filled 2012-08-12 (×9): qty 1000

## 2012-08-12 MED ORDER — SUCCINYLCHOLINE CHLORIDE 20 MG/ML IJ SOLN
INTRAMUSCULAR | Status: DC | PRN
  Administered 2012-08-12: 100 mg via INTRAVENOUS

## 2012-08-12 MED ORDER — LACTATED RINGERS IV SOLN
INTRAVENOUS | Status: DC | PRN
  Administered 2012-08-12 (×3): via INTRAVENOUS

## 2012-08-12 MED ORDER — HYDROMORPHONE 0.3 MG/ML IV SOLN
INTRAVENOUS | Status: AC
  Filled 2012-08-12: qty 25

## 2012-08-12 MED ORDER — ONDANSETRON HCL 4 MG/2ML IJ SOLN
INTRAMUSCULAR | Status: DC | PRN
  Administered 2012-08-12: 4 mg via INTRAVENOUS

## 2012-08-12 MED ORDER — ALVIMOPAN 12 MG PO CAPS
12.0000 mg | ORAL_CAPSULE | Freq: Once | ORAL | Status: AC
  Administered 2012-08-12: 12 mg via ORAL
  Filled 2012-08-12: qty 1

## 2012-08-12 MED ORDER — ENOXAPARIN SODIUM 40 MG/0.4ML ~~LOC~~ SOLN
40.0000 mg | SUBCUTANEOUS | Status: DC
  Administered 2012-08-13 – 2012-08-17 (×5): 40 mg via SUBCUTANEOUS
  Filled 2012-08-12 (×5): qty 0.4

## 2012-08-12 MED ORDER — DIPHENHYDRAMINE HCL 12.5 MG/5ML PO ELIX: 12.5000 mg | ORAL_SOLUTION | Freq: Four times a day (QID) | ORAL | Status: DC | PRN

## 2012-08-12 MED ORDER — HYDROMORPHONE HCL PF 1 MG/ML IJ SOLN
INTRAMUSCULAR | Status: AC
  Filled 2012-08-12: qty 1

## 2012-08-12 MED ORDER — HYDROMORPHONE 0.3 MG/ML IV SOLN
INTRAVENOUS | Status: DC
  Administered 2012-08-12 (×2): 1.2 mg via INTRAVENOUS
  Administered 2012-08-12: 13:00:00 via INTRAVENOUS
  Administered 2012-08-13: 1.2 mg via INTRAVENOUS
  Administered 2012-08-13: 0.9 mg via INTRAVENOUS
  Administered 2012-08-13: 0.2 mg via INTRAVENOUS
  Administered 2012-08-13: 0.6 mg via INTRAVENOUS
  Administered 2012-08-13: 2.1 mg via INTRAVENOUS
  Administered 2012-08-13: 09:00:00 via INTRAVENOUS
  Administered 2012-08-13: 0.799 mg via INTRAVENOUS
  Administered 2012-08-14: 0.2 mg via INTRAVENOUS
  Administered 2012-08-14: 0.199 mg via INTRAVENOUS
  Administered 2012-08-14: 0.799 mg via INTRAVENOUS
  Administered 2012-08-14: 0.6 mg via INTRAVENOUS
  Administered 2012-08-14: 0.399 mg via INTRAVENOUS
  Administered 2012-08-14: 0.4 mg via INTRAVENOUS
  Administered 2012-08-15: 0.399 mg via INTRAVENOUS
  Administered 2012-08-15: 0.4 mg via INTRAVENOUS
  Administered 2012-08-15 (×2): 0.2 mg via INTRAVENOUS
  Administered 2012-08-16: 0.6 mg via INTRAVENOUS
  Filled 2012-08-12: qty 25

## 2012-08-12 MED ORDER — FENTANYL CITRATE 0.05 MG/ML IJ SOLN
INTRAMUSCULAR | Status: DC | PRN
  Administered 2012-08-12: 100 ug via INTRAVENOUS

## 2012-08-12 MED ORDER — 0.9 % SODIUM CHLORIDE (POUR BTL) OPTIME
TOPICAL | Status: DC | PRN
  Administered 2012-08-12: 2000 mL

## 2012-08-12 MED ORDER — LATANOPROST 0.005 % OP SOLN
1.0000 [drp] | Freq: Every day | OPHTHALMIC | Status: DC
  Administered 2012-08-12 – 2012-08-16 (×5): 1 [drp] via OPHTHALMIC
  Filled 2012-08-12: qty 2.5

## 2012-08-12 MED ORDER — LACTATED RINGERS IV SOLN
INTRAVENOUS | Status: DC | PRN
  Administered 2012-08-12 (×2): via INTRAVENOUS

## 2012-08-12 MED ORDER — NEOSTIGMINE METHYLSULFATE 1 MG/ML IJ SOLN
INTRAMUSCULAR | Status: DC | PRN
  Administered 2012-08-12: 5 mg via INTRAVENOUS

## 2012-08-12 MED ORDER — SODIUM CHLORIDE 0.9 % IJ SOLN: 9.0000 mL | INTRAMUSCULAR | Status: DC | PRN

## 2012-08-12 MED ORDER — CISATRACURIUM BESYLATE (PF) 10 MG/5ML IV SOLN
INTRAVENOUS | Status: DC | PRN
  Administered 2012-08-12: 4 mg via INTRAVENOUS
  Administered 2012-08-12: 8 mg via INTRAVENOUS
  Administered 2012-08-12: 2 mg via INTRAVENOUS

## 2012-08-12 MED ORDER — MIDAZOLAM HCL 5 MG/5ML IJ SOLN
INTRAMUSCULAR | Status: DC | PRN
  Administered 2012-08-12: 2 mg via INTRAVENOUS

## 2012-08-12 SURGICAL SUPPLY — 58 items
APPLICATOR COTTON TIP 6IN STRL (MISCELLANEOUS) IMPLANT
BLADE EXTENDED COATED 6.5IN (ELECTRODE) ×2 IMPLANT
BLADE HEX COATED 2.75 (ELECTRODE) ×2 IMPLANT
BLADE SURG SZ10 CARB STEEL (BLADE) ×2 IMPLANT
CANISTER SUCTION 2500CC (MISCELLANEOUS) ×2 IMPLANT
CHLORAPREP W/TINT 26ML (MISCELLANEOUS) ×2 IMPLANT
CLIP TI LARGE 6 (CLIP) IMPLANT
CLOTH BEACON ORANGE TIMEOUT ST (SAFETY) ×2 IMPLANT
COUNTER NEEDLE 20 DBL MAG RED (NEEDLE) ×2 IMPLANT
COVER MAYO STAND STRL (DRAPES) ×4 IMPLANT
DRAPE LAPAROSCOPIC ABDOMINAL (DRAPES) ×2 IMPLANT
DRAPE LG THREE QUARTER DISP (DRAPES) IMPLANT
DRAPE UTILITY W/TAPE 26X15 (DRAPES) ×2 IMPLANT
DRAPE WARM FLUID 44X44 (DRAPE) ×2 IMPLANT
DRSG OPSITE POSTOP 4X12 (GAUZE/BANDAGES/DRESSINGS) ×2 IMPLANT
DRSG PAD ABDOMINAL 8X10 ST (GAUZE/BANDAGES/DRESSINGS) IMPLANT
ELECT REM PT RETURN 9FT ADLT (ELECTROSURGICAL) ×2
ELECTRODE REM PT RTRN 9FT ADLT (ELECTROSURGICAL) ×1 IMPLANT
ENSEAL DEVICE STD TIP 35CM (ENDOMECHANICALS) ×2 IMPLANT
GLOVE BIOGEL PI IND STRL 7.0 (GLOVE) ×1 IMPLANT
GLOVE BIOGEL PI INDICATOR 7.0 (GLOVE) ×1
GLOVE SURG ORTHO 8.0 STRL STRW (GLOVE) ×4 IMPLANT
GOWN STRL NON-REIN LRG LVL3 (GOWN DISPOSABLE) IMPLANT
GOWN STRL REIN XL XLG (GOWN DISPOSABLE) ×12 IMPLANT
KIT BASIN OR (CUSTOM PROCEDURE TRAY) ×2 IMPLANT
LEGGING LITHOTOMY PAIR STRL (DRAPES) IMPLANT
LIGASURE IMPACT 36 18CM CVD LR (INSTRUMENTS) ×2 IMPLANT
NS IRRIG 1000ML POUR BTL (IV SOLUTION) ×4 IMPLANT
PACK GENERAL/GYN (CUSTOM PROCEDURE TRAY) ×2 IMPLANT
PENCIL BUTTON HOLSTER BLD 10FT (ELECTRODE) ×2 IMPLANT
RELOAD PROXIMATE 75MM BLUE (ENDOMECHANICALS) ×2 IMPLANT
RELOAD STAPLER LINEAR PROX 30 (STAPLE) ×1 IMPLANT
SCALPEL HARMONIC ACE (MISCELLANEOUS) IMPLANT
SEALER TISSUE X1 CVD JAW (INSTRUMENTS) IMPLANT
SPONGE GAUZE 4X4 12PLY (GAUZE/BANDAGES/DRESSINGS) ×2 IMPLANT
STAPLER PROXIMATE 75MM BLUE (STAPLE) ×2 IMPLANT
STAPLER RELOAD LINEAR PROX 30 (STAPLE) ×2
STAPLER VISISTAT 35W (STAPLE) ×2 IMPLANT
SUCTION POOLE TIP (SUCTIONS) ×2 IMPLANT
SUT NOV 1 T60/GS (SUTURE) IMPLANT
SUT NOVA NAB DX-16 0-1 5-0 T12 (SUTURE) ×8 IMPLANT
SUT NOVA T20/GS 25 (SUTURE) IMPLANT
SUT PDS AB 1 TP1 96 (SUTURE) IMPLANT
SUT SILK 2 0 (SUTURE) ×1
SUT SILK 2 0 SH CR/8 (SUTURE) ×12 IMPLANT
SUT SILK 2 0SH CR/8 30 (SUTURE) IMPLANT
SUT SILK 2-0 18XBRD TIE 12 (SUTURE) ×1 IMPLANT
SUT SILK 2-0 30XBRD TIE 12 (SUTURE) IMPLANT
SUT SILK 3 0 (SUTURE) ×1
SUT SILK 3 0 SH CR/8 (SUTURE) ×2 IMPLANT
SUT SILK 3-0 18XBRD TIE 12 (SUTURE) ×1 IMPLANT
TAPE CLOTH SURG 4X10 WHT LF (GAUZE/BANDAGES/DRESSINGS) ×2 IMPLANT
TOWEL OR 17X26 10 PK STRL BLUE (TOWEL DISPOSABLE) ×4 IMPLANT
TRAY FOLEY CATH 14FRSI W/METER (CATHETERS) IMPLANT
TRAY FOLEY CATH 16FRSI W/METER (SET/KITS/TRAYS/PACK) ×2 IMPLANT
WATER STERILE IRR 1500ML POUR (IV SOLUTION) IMPLANT
YANKAUER SUCT BULB TIP 10FT TU (MISCELLANEOUS) ×2 IMPLANT
YANKAUER SUCT BULB TIP NO VENT (SUCTIONS) ×2 IMPLANT

## 2012-08-12 NOTE — Transfer of Care (Signed)
Immediate Anesthesia Transfer of Care Note  Patient: Victor [name]  Procedure(s) Performed: Procedure(s):  SIGMOID COLECTOMY (N/A) APPENDECTOMY (N/A)  Patient Location: PACU  Anesthesia Type:General  Level of Consciousness: awake and alert   Airway & Oxygen Therapy: Patient Spontanous Breathing and Patient connected to face mask oxygen  Post-op Assessment: Report given to PACU RN  Post vital signs: Reviewed and stable  Complications: No apparent anesthesia complications

## 2012-08-12 NOTE — Anesthesia Preprocedure Evaluation (Signed)
Anesthesia Evaluation  Patient identified by MRN, date of birth, ID band Patient awake    Reviewed: Allergy & Precautions, H&P , NPO status , Patient's Chart, lab work & pertinent test results  Airway Mallampati: III TM Distance: <3 FB Neck ROM: Full    Dental no notable dental hx.    Pulmonary neg pulmonary ROS,  breath sounds clear to auscultation  Pulmonary exam normal       Cardiovascular negative cardio ROS  Rhythm:Regular Rate:Normal     Neuro/Psych negative neurological ROS  negative psych ROS   GI/Hepatic negative GI ROS, Neg liver ROS,   Endo/Other  Morbid obesity  Renal/GU negative Renal ROS  negative genitourinary   Musculoskeletal negative musculoskeletal ROS (+)   Abdominal   Peds negative pediatric ROS (+)  Hematology negative hematology ROS (+)   Anesthesia Other Findings   Reproductive/Obstetrics negative OB ROS                           Anesthesia Physical Anesthesia Plan  ASA: II  Anesthesia Plan: General   Post-op Pain Management:    Induction: Intravenous  Airway Management Planned: Oral ETT  Additional Equipment:   Intra-op Plan:   Post-operative Plan: Extubation in OR  Informed Consent: I have reviewed the patients History and Physical, chart, labs and discussed the procedure including the risks, benefits and alternatives for the proposed anesthesia with the patient or authorized representative who has indicated his/her understanding and acceptance.   Dental advisory given  Plan Discussed with: CRNA and Surgeon  Anesthesia Plan Comments:         Anesthesia Quick Evaluation  

## 2012-08-12 NOTE — Anesthesia Postprocedure Evaluation (Signed)
  Anesthesia Post-op Note  Patient: Victor [name]  Procedure(s) Performed: Procedure(s) (LRB):  SIGMOID COLECTOMY (N/A) APPENDECTOMY (N/A)  Patient Location: PACU  Anesthesia Type: General  Level of Consciousness: awake and alert   Airway and Oxygen Therapy: Patient Spontanous Breathing  Post-op Pain: mild  Post-op Assessment: Post-op Vital signs reviewed, Patient's Cardiovascular Status Stable, Respiratory Function Stable, Patent Airway and No signs of Nausea or vomiting  Last Vitals:  Filed Vitals:   08/12/12 1300  BP: 164/75  Pulse: 89  Temp: 37 C  Resp: 11    Post-op Vital Signs: stable   Complications: No apparent anesthesia complications

## 2012-08-12 NOTE — Anesthesia Procedure Notes (Signed)
Procedure Name: Intubation Date/Time: 08/12/2012 10:01 AM Performed by: Hulan Fess Pre-anesthesia Checklist: Patient identified, Emergency Drugs available, Suction available, Patient being monitored and Timeout performed Patient Re-evaluated:Patient Re-evaluated prior to inductionOxygen Delivery Method: Circle system utilized Preoxygenation: Pre-oxygenation with 100% oxygen Intubation Type: IV induction Ventilation: Mask ventilation without difficulty Laryngoscope Size: Mac and 4 Tube type: Oral Tube size: 8.0 mm Number of attempts: 2 Placement Confirmation: positive ETCO2 and breath sounds checked- equal and bilateral Secured at: 22 cm Dental Injury: Teeth and Oropharynx as per pre-operative assessment

## 2012-08-12 NOTE — H&P (Signed)
Victor [name] is an 72 y.o. male.    [facility]  Chief Complaint: recurrent diverticulitis  HPI: Patient is a 72 year old male referred by his primary care physician for management of long-standing sigmoid diverticular disease. I first saw this patient in 2000 when he was admitted with acute diverticulitis with microperforation. He resolved with medical management. Patient has had 3 additional episodes of acute diverticulitis since that time. His most recent episode of acute diverticulitis was the spring. This was documented on sequential CT scanning. Patient was treated with oral antibiotics and bowel rest. Antibiotic therapy was completed a little over one week ago. He now presents at the request of his primary care physician for evaluation for resection of the involved segment of the colon. Recent CT scan is reviewed. Acute diverticulitis involving the sigmoid colon is noted. There is either a large diverticulum or contained perforation also noted. There is adherence of the sigmoid colon to the dome of the bladder. Patient denies any signs or symptoms of fistula formation. Patient has had screening colonoscopy on a regular basis by Dr. Bernette Redbird. His most recent study was a little over 2 years ago.  Past Medical History  Diagnosis Date  . Arthritis   . Hyperlipidemia   . Multiple allergies   . Diverticulitis   . Scoliosis     "adult"  . Stenosis of lumbosacral spine   . Restless leg syndrome   . Geographic tongue     Past Surgical History  Procedure Laterality Date  . Cholecystectomy    . Knee arthroscopy Left   . Rotator cuff repair      bilateral   . Shoulder surgery Left   . Cystoscopy  40 years ago  . Tonsillectomy    . Appendectomy  as child  . Colonoscopy      many  . Cyst removed Left     wrist    Family History  Problem Relation Age of Onset  . Heart disease Mother   . Cancer Father     colon & lung  . Cancer Maternal  Grandfather     bladder   Social History:  reports that he has never smoked. He has never used smokeless tobacco. He reports that he does not drink alcohol or use illicit drugs.  Allergies:  Allergies  Allergen Reactions  . Hydrocodone-Acetaminophen Itching    Medications Prior to Admission  Medication Sig Dispense Refill  . acetaminophen (TYLENOL) 500 MG tablet Take 1,000 mg by mouth every 6 (six) hours as needed for pain.       Marland Kitchen latanoprost (XALATAN) 0.005 % ophthalmic solution Place 1 drop into both eyes at bedtime.       . meloxicam (MOBIC) 15 MG tablet Take 15 mg by mouth daily.       . simvastatin (ZOCOR) 40 MG tablet Take 40 mg by mouth at bedtime.         Results for orders placed during the hospital encounter of 08/12/12 (from the past 48 hour(s))  TYPE AND SCREEN     Status: None   Collection Time    08/12/12  8:08 AM      Result Value Range   ABO/RH(D) A POS     Antibody Screen NEG     Sample Expiration 08/15/2012     No results found.  Review of Systems  Constitutional: Negative.   HENT: Negative.   Eyes: Negative.   Respiratory: Negative.   Cardiovascular: Negative.  Gastrointestinal: Negative.   Genitourinary: Negative.   Musculoskeletal: Negative.   Skin: Negative.   Neurological: Negative.   Endo/Heme/Allergies: Negative.   Psychiatric/Behavioral: Negative.     Pulse 73, temperature 98.3 F (36.8 C), temperature source Oral, resp. rate 20, SpO2 95.00%. Physical Exam  Constitutional: He is oriented to person, place, and time. He appears well-developed and well-nourished. No distress.  HENT:  Head: Normocephalic and atraumatic.  Right Ear: External ear normal.  Left Ear: External ear normal.  Mouth/Throat: Oropharynx is clear and moist.  Eyes: Conjunctivae are normal. Pupils are equal, round, and reactive to light. No scleral icterus.  Neck: Normal range of motion. Neck supple. No thyromegaly present.  Cardiovascular: Normal rate, regular rhythm  and normal heart sounds.   Respiratory: Effort normal and breath sounds normal. No respiratory distress. He has no wheezes.  GI: Soft. Bowel sounds are normal. He exhibits no distension and no mass. There is no tenderness. There is no rebound and no guarding.  Small incisional hernia at umbilicus, reducible; obese  Musculoskeletal: Normal range of motion. He exhibits no edema.  Lymphadenopathy:    He has no cervical adenopathy.  Neurological: He is alert and oriented to person, place, and time.  Skin: Skin is warm and dry.  Psychiatric: He has a normal mood and affect. His behavior is normal.     Assessment/Plan 1. Diverticular disease, recurrent episodes  2. Incisional hernia at umbilicus   Plan sigmoid colectomy with closure of incisional hernia  The risks and benefits of the procedure have been discussed at length with the patient.  The patient understands the proposed procedure, potential alternative treatments, and the course of recovery to be expected.  All of the patient's questions have been answered at this time.  The patient wishes to proceed with surgery.  The risks and benefits of the procedure have been discussed at length with the patient.  The patient understands the proposed procedure, potential alternative treatments, and the course of recovery to be expected.  All of the patient's questions have been answered at this time.  The patient wishes to proceed with surgery.  Velora Heckler, MD, [facility] Office: [phone]    [name] M 08/12/2012, 9:03 AM

## 2012-08-12 NOTE — Progress Notes (Signed)
Quick Note:  These results are acceptable for scheduled surgery.  Denali Becvar M. Andria Head, MD, FACS [facility] Office: [phone]   ______ 

## 2012-08-12 NOTE — Brief Op Note (Signed)
08/12/2012  12:17 PM  PATIENT:  Victor [name]  72 y.o. male  PRE-OPERATIVE DIAGNOSIS:  diverticular disease    POST-OPERATIVE DIAGNOSIS:  diverticular disease, chronic appendicitis  PROCEDURE:  Procedure(s):  SIGMOID COLECTOMY (N/A) APPENDECTOMY (N/A)  SURGEON:  Surgeon(s) and Role:    * Velora Heckler, MD - Primary    * Romie Levee, MD - Assisting  ANESTHESIA:   general  EBL:  Total I/O In: 2000 [I.V.:2000] Out: 350 [Urine:250; Blood:100]  BLOOD ADMINISTERED:none  DRAINS: none   LOCAL MEDICATIONS USED:  NONE  SPECIMEN:  Excision  DISPOSITION OF SPECIMEN:  PATHOLOGY  COUNTS:  YES  TOURNIQUET:  * No tourniquets in log *  DICTATION: .Other Dictation: Dictation Number [id number]  PLAN OF CARE: Admit to inpatient   PATIENT DISPOSITION:  PACU - hemodynamically stable.   Delay start of Pharmacological VTE agent (>24hrs) due to surgical blood loss or risk of bleeding: yes  Velora Heckler, MD, [facility] Office: [phone]

## 2012-08-13 ENCOUNTER — Encounter (HOSPITAL_COMMUNITY): Payer: Self-pay | Admitting: Surgery

## 2012-08-13 LAB — BASIC METABOLIC PANEL
Calcium: 8.9 mg/dL (ref 8.4–10.5)
GFR calc Af Amer: >90 mL/min (ref >90)
GFR calc non Af Amer: 86 mL/min — ABNORMAL LOW (ref >90)
Potassium: 4.1 mEq/L (ref 3.5–5.1)
Sodium: 135 mEq/L (ref 135–145)

## 2012-08-13 LAB — CBC
MCHC: 33.3 g/dL (ref 30.0–36.0)
RDW: 14 % (ref 11.5–15.5)

## 2012-08-13 MED ORDER — BIOTENE DRY MOUTH MT LIQD
15.0000 mL | Freq: Two times a day (BID) | OROMUCOSAL | Status: DC
  Administered 2012-08-15 – 2012-08-16 (×4): 15 mL via OROMUCOSAL

## 2012-08-13 MED ORDER — CHLORHEXIDINE GLUCONATE 0.12 % MT SOLN
15.0000 mL | Freq: Two times a day (BID) | OROMUCOSAL | Status: DC
  Administered 2012-08-13 – 2012-08-16 (×7): 15 mL via OROMUCOSAL
  Filled 2012-08-13 (×10): qty 15

## 2012-08-13 MED ORDER — ALVIMOPAN 12 MG PO CAPS
12.0000 mg | ORAL_CAPSULE | Freq: Two times a day (BID) | ORAL | Status: DC
  Administered 2012-08-13 – 2012-08-17 (×9): 12 mg via ORAL
  Filled 2012-08-13 (×10): qty 1

## 2012-08-13 NOTE — Progress Notes (Signed)
Patient ID: Victor [name], male   DOB: 1940-08-16, 72 y.o.   MRN: [id number]  General Surgery - [facility] - Progress Note  POD# 1  Subjective: Patient without complaints post op sigmoid colectomy.  Foley in place.  Some itching with PCA Dilaudid - will monitor.  No nausea or emesis.  Objective: Vital signs in last 24 hours: Temp:  [97.6 F (36.4 C)-98.6 F (37 C)] 97.8 F (36.6 C) (08/12 0601) Pulse Rate:  [75-93] 78 (08/12 0601) Resp:  [9-22] 20 (08/12 0753) BP: (126-169)/(57-90) 160/80 mmHg (08/12 0601) SpO2:  [92 %-98 %] 98 % (08/12 0753) Weight:  [250 lb (113.399 kg)] 250 lb (113.399 kg) (08/11 1331) Last BM Date: 08/12/12  Intake/Output from previous day: 08/11 0701 - 08/12 0700 In: 5400 [P.O.:300; I.V.:5100] Out: 2600 [Urine:2500; Blood:100]  Exam: HEENT - clear, not icteric Neck - soft Chest - clear bilaterally Cor - RRR, no murmur Abd - soft, obese, mild distension; BS present; wound clear and dry with honeycomb dressing Ext - no significant edema Neuro - grossly intact, no focal deficits  Lab Results:   Recent Labs  08/12/12 1418 08/13/12 0353  WBC 23.2* 17.3*  HGB 15.2 14.5  HCT 44.5 43.6  PLT 240 207     Recent Labs  08/12/12 1418 08/13/12 0353  NA  --  135  K  --  4.1  CL  --  98  CO2  --  32  GLUCOSE  --  135*  BUN  --  9  CREATININE 0.71 0.85  CALCIUM  --  8.9    Studies/Results: No results found.  Assessment / Plan: 1.  Status post sigmoid colectomy for diverticular disease, ? Chronic appendicitis  Allow clear liquid diet  OOB, ambulate in halls  Foley out tomorrow AM  Velora Heckler, MD, [facility] Office: [phone]  08/13/2012

## 2012-08-13 NOTE — Op Note (Signed)
NAMEDJIBRIL, GLOGOWSKI             ACCOUNT NO.:  [id number]  MEDICAL RECORD NO.:  [id number]  LOCATION:  1539                         FACILITY:  [facility]  PHYSICIAN:  Velora Heckler, MD      DATE OF BIRTH:  1940/01/30  DATE OF PROCEDURE:  08/12/2012                               OPERATIVE REPORT   PREOPERATIVE DIAGNOSIS:  Sigmoid diverticular disease.  POSTOPERATIVE DIAGNOSES: 1. Sigmoid diverticular disease. 2. Probable chronic appendicitis.  PROCEDURE: 1. Sigmoid colectomy. 2. Appendectomy.  SURGEON:  Velora Heckler, MD, FACS  ASSISTANT:  Romie Levee, MD  ANESTHESIA:  General per Dr. Eilene Ghazi.  ESTIMATED BLOOD LOSS:  Minimal.  PREPARATION:  ChloraPrep.  COMPLICATIONS:  None.  INDICATIONS:  The patient is a 72 year old male referred by Dr. Geoffry Paradise for management of long-standing sigmoid diverticular disease. The patient was initially admitted in the year 2000 with acute diverticulitis with micro perforation.  He was managed medically.  He has had 3 additional episodes of acute diverticulitis all managed medically.  His most recent episode was in the spring of 2014.  The patient now comes to Surgery for sigmoid colectomy.  BODY OF REPORT:  The procedure was done in OR #11 at the [facility].  The patient was brought to the operating room, placed in supine position on the operating room table.  Following administration of general anesthesia, the patient was positioned and then prepped and draped in the usual aseptic fashion.  After ascertaining that an adequate level of anesthesia had been achieved, a midline abdominal incision was made with a #10 blade.  Dissection was carried through subcutaneous tissues.  Fascia was incised in the midline and the peritoneal cavity was entered cautiously.  Abdomen was explored. The upper abdomen was grossly normal.  The orogastric tube was placed within the stomach.  Adhesions to the undersurface of the  gallbladder were consistent with previous cholecystectomy.  In the pelvis, there was a markedly redundant sigmoid colon.  There were not a lot of diverticula noted.  The sigmoid colon was adherent to the lower midline abdominal wall above the level of the bladder.  This was dissected off sharply with Metzenbaum scissors.  This may have been a site of contained perforation.  It does not appear to be a colovesical fistula.  It was completely mobilized and separated.  The site at the abdominal wall was imbricated with interrupted 2-0 silk sutures.  Continuing to follow the sigmoid colon, there was an area which was densely adherent to a mass in the right lower quadrant on the abdomen. The entire area was fixed to the retroperitoneum.  This does not appear to be neoplastic and was more likely inflammatory.  With some difficulty, the area was mobilized from the retroperitoneum with a combination of blunt and sharp dissection.  A cavity was opened which contained chocolate brown debris.  This appeared to be a chronic appendiceal process.  A portion of the appendiceal wall was densely adherent at the pelvic brim and was left in situ due to concern over underlying structures.  The remainder of what appeared to be a chronically inflamed appendix was dissected free.  The mesoappendix was divided with  the ligature.  The base of the appendix  was stapled with a TA 30 stapler at its junction with the cecal wall, and then divided sharply.  The remaining mesentery was divided with the ligature allowing for mobilization of the entire sigmoid colon with the attached chronic appendiceal process adherent to the sigmoid colon.  The distal sigmoid colon was identified at its junction with the upper rectum.  At this point, it was cleared circumferentially and transected with a GIA stapler.  Mesentery was then divided with the ligature. Larger vessels were suture ligated with 2-0 silk figure-of-eight  suture ligatures.  Dissection was carried proximally.  The proximal sigmoid colon was freed from lateral peritoneal attachments with sharp dissection and use of the electrocautery.  A point at the proximal sigmoid colon was then selected and transected with a GIA stapler. Remaining mesentery was divided with the ligature and the entire sigmoid colon was removed.  It was submitted to pathology per Dr. Jimmy Picket who reviewed the specimen and agreed that this looked like inflammatory processes consistent with diverticular disease and probable chronic appendicitis.  No evidence of neoplasm was identified on gross examination.  Next, an end-to-end anastomosis was created between the distal descending colon and the proximal rectum.  The staple lines were excised.  Bowel wall was prepared circumferentially.  2-0 silk stay sutures were placed.  Anastomosis was performed as an end-to-end anastomosis with interrupted 2-0 silk sutures.  No tension was present on the anastomosis.  The bowel edges appeared to be well vascularized.  Abdomen was copiously irrigated with warm saline.  The small amount of appendiceal tissue adherent to the right pelvic brim was cauterized with the electrocautery so as to ablate the mucosa.  Good hemostasis was noted.  Pelvis was irrigated.  Omentum was used to cover the small bowel.  Good hemostasis was noted.  Midline incision was closed with interrupted #1 Novafil simple sutures. Subcutaneous tissues were irrigated.  Skin was closed with stainless steel staples.  Sterile dressing was applied.  The patient was awakened from anesthesia and brought to the recovery room.  The patient tolerated the procedure well.   Velora Heckler, MD, [facility] Office: [phone]    TMG/MEDQ  D:  08/12/2012  T:  08/13/2012  Job:  [id number]  cc:   Geoffry Paradise, M.D. Fax: [phone]  Bernette Redbird, M.D. Fax: [phone]

## 2012-08-13 NOTE — Progress Notes (Signed)
INITIAL NUTRITION ASSESSMENT  DOCUMENTATION CODES Per approved criteria  -Obesity Unspecified   INTERVENTION: - Diet advancement per MD - Educated pt on low fiber diet r/t colectomy and encouraged pt to gradually add fiber back into diet as pt heals and inflammation resolves - Will continue to monitor   NUTRITION DIAGNOSIS: Inadequate oral intake related to clear liquid diet as evidenced by diet order.   Goal: Advance diet as tolerated to low fiber diet  Monitor:  Weights, labs, diet advancement  Reason for Assessment: Nutrition risk   72 y.o. male  Admitting Dx: Diverticulosis of colon without hemorrhage  ASSESSMENT: Pt with history of long standing sigmoid diverticular disease with 4 episodes of acute diverticulitis, one with microperforation. POD# 1 sigmoid colectomy with appendectomy. Met with pt who reports he has been avoiding nuts, seeds, and corn since his diagnosis. Reports excellent appetite "like a horse" PTA. States he lost 8-9 pounds unintentionally since May of this year r/t being on liquid diet for surgery and being "cleaned out". Reports tolerating clear liquid diet today without any nausea or pain.   Height: Ht Readings from Last 1 Encounters:  08/12/12 5\' 8"  (1.727 m)    Weight: Wt Readings from Last 1 Encounters:  08/12/12 250 lb (113.399 kg)    Ideal Body Weight: 154 lb   % Ideal Body Weight: 162%  Wt Readings from Last 10 Encounters:  08/12/12 250 lb (113.399 kg)  08/12/12 250 lb (113.399 kg)  08/06/12 250 lb (113.399 kg)  06/25/12 255 lb (115.667 kg)    Usual Body Weight: 258-259 lb per pt  % Usual Body Weight: 96-97%  BMI:  Body mass index is 38.02 kg/(m^2).  Class II obesity  Estimated Nutritional Needs: Kcal: 1900-2100 Protein: 85-105g Fluid: 1.9-2.1L/day  Skin: Abdominal incision  Diet Order: Clear Liquid  EDUCATION NEEDS: -Education needs addressed - used teach back method to educate pt and family on low fiber diet for  diverticulitis and post-op GI surgery. Handouts provided. Expect good compliance.    Intake/Output Summary (Last 24 hours) at 08/13/12 1413 Last data filed at 08/13/12 1325  Gross per 24 hour  Intake    540 ml  Output   2800 ml  Net  -2260 ml    Last BM: 8/11  Labs:   Recent Labs Lab 08/12/12 1418 08/13/12 0353  NA  --  135  K  --  4.1  CL  --  98  CO2  --  32  BUN  --  9  CREATININE 0.71 0.85  CALCIUM  --  8.9  GLUCOSE  --  135*    CBG (last 3)  No results found for this basename: GLUCAP,  in the last 72 hours  Scheduled Meds: . alvimopan  12 mg Oral BID  . enoxaparin (LOVENOX) injection  40 mg Subcutaneous Q24H  . HYDROmorphone PCA 0.3 mg/mL   Intravenous Q4H  . latanoprost  1 drop Both Eyes QHS    Continuous Infusions: . dexrose 5 % and 0.45 % NaCl with KCl 30 mEq/L 100 mL/hr at 08/13/12 1610    Past Medical History  Diagnosis Date  . Arthritis   . Hyperlipidemia   . Multiple allergies   . Diverticulitis   . Scoliosis     "adult"  . Stenosis of lumbosacral spine   . Restless leg syndrome   . Geographic tongue     Past Surgical History  Procedure Laterality Date  . Cholecystectomy    . Knee arthroscopy Left   .  Rotator cuff repair      bilateral   . Shoulder surgery Left   . Cystoscopy  40 years ago  . Tonsillectomy    . Appendectomy  as child  . Colonoscopy      many  . Cyst removed Left     wrist  . Partial colectomy N/A 08/12/2012    Procedure:  SIGMOID COLECTOMY;  Surgeon: Velora Heckler, MD;  Location: [facility];  Service: General;  Laterality: N/A;  . Appendectomy N/A 08/12/2012    Procedure: APPENDECTOMY;  Surgeon: Velora Heckler, MD;  Location: [facility];  Service: General;  Laterality: N/A;     Levon Hedger MS, RD, LDN [phone] Pager [phone] After Hours Pager

## 2012-08-14 LAB — BASIC METABOLIC PANEL
GFR calc non Af Amer: >90 mL/min (ref >90)
Glucose, Bld: 135 mg/dL — ABNORMAL HIGH (ref 70–99)
Potassium: 4.6 mEq/L (ref 3.5–5.1)
Sodium: 136 mEq/L (ref 135–145)

## 2012-08-14 LAB — CBC
Hemoglobin: 15.2 g/dL (ref 13.0–17.0)
MCHC: 33.3 g/dL (ref 30.0–36.0)
Platelets: 206 10*3/uL (ref 150–400)
RBC: 4.91 MIL/uL (ref 4.22–5.81)

## 2012-08-14 MED ORDER — PROMETHAZINE HCL 25 MG/ML IJ SOLN
12.5000 mg | INTRAMUSCULAR | Status: DC | PRN
  Administered 2012-08-14: 12:00:00 via INTRAVENOUS
  Filled 2012-08-14: qty 1

## 2012-08-14 NOTE — Progress Notes (Signed)
Patient ID: SERAFIN DECATUR, male   DOB: Mar 23, 1940, 72 y.o.   MRN: [id number]  General Surgery - [facility] - Progress Note  POD# 2  Subjective: Patient without complaint - mild back and neck pain from bed.  Tolerating clear liquid diet.  Ambulatory.  Foley was removed yesterday - voiding without difficulty.  Objective: Vital signs in last 24 hours: Temp:  [97.8 F (36.6 C)-98.7 F (37.1 C)] 98 F (36.7 C) (08/13 0656) Pulse Rate:  [76-89] 84 (08/13 0656) Resp:  [15-20] 18 (08/13 0756) BP: (151-173)/(69-89) 173/89 mmHg (08/13 0656) SpO2:  [2 %-99 %] 2 % (08/13 0756) Last BM Date: 08/12/12  Intake/Output from previous day: 08/12 0701 - 08/13 0700 In: 4580 [P.O.:480; I.V.:4050; IV Piggyback:50] Out: 3850 [Urine:3850]  Exam: HEENT - clear, not icteric Neck - soft Chest - clear bilaterally Cor - RRR, no murmur Abd - soft, mild distension; rare BS present; wound clear and dry with intact honeycomb dressing Ext - no significant edema Neuro - grossly intact, no focal deficits  Lab Results:   Recent Labs  08/13/12 0353 08/14/12 0440  WBC 17.3* 17.5*  HGB 14.5 15.2  HCT 43.6 45.6  PLT 207 206     Recent Labs  08/13/12 0353 08/14/12 0440  NA 135 136  K 4.1 4.6  CL 98 97  CO2 32 30  GLUCOSE 135* 135*  BUN 9 6  CREATININE 0.85 0.76  CALCIUM 8.9 9.4    Studies/Results: No results found.  Assessment / Plan: 1.  Status post sigmoid colectomy, appendectomy  Continue clear liquid diet today  Decrease IVF rate  OOB, ambulate in halls  PCA for pain control  Lovenox for DVT prophylaxis  K pad for neck and back pain  Velora Heckler, MD, [facility] Office: [phone]  08/14/2012

## 2012-08-15 NOTE — Progress Notes (Signed)
Patient ID: Victor [name], male   DOB: 03/06/1940, 72 y.o.   MRN: [id number]  General Surgery - [facility] - Progress Note  POD# 3  Subjective: Patient with nausea and hiccups yesterday - now resolved.  No flatus of BM yet.  Voiding normally.  Tolerating clear liquids.  Objective: Vital signs in last 24 hours: Temp:  [98.4 F (36.9 C)-98.9 F (37.2 C)] 98.4 F (36.9 C) (08/14 0545) Pulse Rate:  [73-96] 73 (08/14 0545) Resp:  [14-18] 15 (08/14 0800) BP: (152-172)/(70-78) 171/78 mmHg (08/14 0545) SpO2:  [96 %-99 %] 97 % (08/14 0800) Last BM Date: 08/12/12  Intake/Output from previous day: 08/13 0701 - 08/14 0700 In: 860.5 [I.V.:860.5] Out: 2400 [Urine:2400]  Exam: HEENT - clear, not icteric Neck - soft Chest - clear bilaterally Cor - RRR, no murmur Abd - softer, less distended; wound clear and dry; BS present Ext - no significant edema Neuro - grossly intact, no focal deficits  Lab Results:   Recent Labs  08/13/12 0353 08/14/12 0440  WBC 17.3* 17.5*  HGB 14.5 15.2  HCT 43.6 45.6  PLT 207 206     Recent Labs  08/13/12 0353 08/14/12 0440  NA 135 136  K 4.1 4.6  CL 98 97  CO2 32 30  GLUCOSE 135* 135*  BUN 9 6  CREATININE 0.85 0.76  CALCIUM 8.9 9.4    Studies/Results: No results found.  Assessment / Plan: 1.  Status post sigmoid colectomy, appendectomy  Advance to full liquid diet today  OOB, ambulate in halls  Velora Heckler, MD, [facility] Office: [phone]  08/15/2012

## 2012-08-16 MED ORDER — HYDROCODONE-ACETAMINOPHEN 5-325 MG PO TABS
1.0000 | ORAL_TABLET | ORAL | Status: DC | PRN
  Administered 2012-08-16: 2 via ORAL
  Filled 2012-08-16: qty 2

## 2012-08-16 MED ORDER — LIP MEDEX EX OINT
TOPICAL_OINTMENT | CUTANEOUS | Status: AC
  Administered 2012-08-16: 22:00:00
  Filled 2012-08-16: qty 7

## 2012-08-16 NOTE — Progress Notes (Signed)
Patient ID: Victor [name], male   DOB: [birth year], 72 y.o.   MRN: [id number]  General Surgery - [facility] - Progress Note  POD# 4  Subjective: Patient without complaints.  Nausea resolved.  Tolerating full liquid diet.  No flatus.  No BM.  Objective: Vital signs in last 24 hours: Temp:  [97.6 F (36.4 C)-97.8 F (36.6 C)] 97.8 F (36.6 C) (08/15 0538) Pulse Rate:  [68-75] 68 (08/15 0538) Resp:  [12-20] 15 (08/15 0821) BP: (153-173)/(79-84) 173/79 mmHg (08/15 0538) SpO2:  [94 %-98 %] 98 % (08/15 0821) Last BM Date: 08/12/12  Intake/Output from previous day: 08/14 0701 - 08/15 0700 In: 2070 [P.O.:920; I.V.:1150] Out: 1350 [Urine:1350]  Exam: HEENT - clear, not icteric Neck - soft Chest - clear bilaterally Cor - RRR, no murmur Abd - softer, mild distension; BS present; wound clear and dry Ext - no significant edema Neuro - grossly intact, no focal deficits  Lab Results:   Recent Labs  08/14/12 0440  WBC 17.5*  HGB 15.2  HCT 45.6  PLT 206     Recent Labs  08/14/12 0440  NA 136  K 4.6  CL 97  CO2 30  GLUCOSE 135*  BUN 6  CREATININE 0.76  CALCIUM 9.4    Studies/Results: No results found.  Assessment / Plan: 1.  Status post sigmoid colectomy, appendectomy  Advance to regular diet  Discontinue PCA  Vicodin po  Ambulate  Likely home 1-2 days  Velora Heckler, MD, [facility] Office: [phone]  08/16/2012

## 2012-08-17 MED ORDER — HYDROCODONE-ACETAMINOPHEN 5-325 MG PO TABS: 1.0000 | ORAL_TABLET | ORAL | Status: DC | PRN

## 2012-08-17 NOTE — Discharge Summary (Signed)
Physician Discharge Summary [facility]  Patient ID: Victor [name] MRN: [id number] DOB/AGE: [birth year] 72 y.o.  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  Post op course without complication.  Gradual resolution of ileus.  Tolerating diet.  Ambulatory.  Prepared for discharge on POD#5.  Consults: None  Significant Diagnostic Studies: labs  Treatments: surgery: sigmoid colectomy and appendectomy  Discharge Exam: Blood pressure 155/84, pulse 93, temperature 97.9 F (36.6 C), temperature source Oral, resp. rate 18, height 5\' 8"  (1.727 m), weight 250 lb (113.399 kg), SpO2 96.00%. HEENT - clear Neck - soft Chest - clear bilaterally Cor - RRR Abd - soft without distension; active BS; wound clear and dry  Disposition: Home with family  Discharge Orders   Future Appointments Provider Department Dept Phone   08/26/2012 11:00 AM Velora Heckler, MD [facility], [place] [phone]   Future Orders Complete By Expires   Diet - low sodium heart healthy  As directed    Discharge instructions  As directed    Comments:     [facility]  OPEN ABDOMINAL SURGERY: POST OP INSTRUCTIONS  Always review your discharge instruction sheet given to you by the facility where your surgery was performed.  A prescription for pain medication may be given to you upon discharge.  Take your pain medication as prescribed.  If narcotic pain medicine is not needed, then you may take acetaminophen (Tylenol) or ibuprofen (Advil) as needed. Take your usually prescribed medications unless otherwise directed. If you need a refill on your pain medication, please contact your pharmacy. They will contact our office to request  authorization.  Prescriptions will not be filled after 5 pm or on weekends. You should follow a light diet the first few days after arrival home, such as soup and crackers, unless your doctor has advised otherwise. A high-fiber, low fat diet can be resumed as tolerated.  Be sure to include plenty of fluids daily.  Most patients will experience some swelling and bruising in the area of the incision. Ice packs will help. Swelling and bruising can take several days to resolve. It is common to experience some constipation if taking pain medication after surgery.  Increasing fluid intake and taking a stool softener will usually help or prevent this problem from occurring.  A mild laxative (Milk of Magnesia or Miralax) should be taken according to package directions if there are no bowel movements after 48 hours.  You may have steri-strips (small skin tapes) in place directly over the incision.  These strips should be left on the skin for 7-10 days.  If your surgeon used skin glue on the incision, you may shower in 24 hours.  The glue will flake off over the next 2-3 weeks.  Any sutures or staples will be removed at the office during your follow-up visit. You may find that a light gauze bandage over your incision may keep your staples from being rubbed or pulled. You may shower and replace the bandage daily. ACTIVITIES:  You may resume regular (light) daily activities beginning the next day-such as daily self-care, walking, climbing stairs-gradually increasing activities as tolerated.  You may have sexual intercourse when it is comfortable.  Refrain from any heavy lifting or straining until approved by your doctor.  You may drive when  you no longer are taking prescription pain medication, you can comfortably wear a seatbelt, and you can safely maneuver your car and apply brakes. You should see your doctor in the office for a follow-up appointment approximately two weeks after your surgery.  Make sure that you call  for this appointment within a day or two after you arrive home to insure a convenient appointment time.  WHEN TO CALL YOUR DOCTOR: Fever greater than 101.0 Inability to urinate Persistent nausea and/or vomiting Extreme swelling or bruising Continued bleeding from incision Increased pain, redness, or drainage from the incision Difficulty swallowing or breathing Muscle cramping or spasms Numbness or tingling in hands or around lips  IF YOU HAVE DISABILITY OR FAMILY LEAVE FORMS, YOU MUST BRING THEM TO THE OFFICE FOR PROCESSING.  PLEASE DO NOT GIVE THEM TO YOUR DOCTOR.  The clinic staff is available to answer your questions during regular business hours.  Please don't hesitate to call and ask to speak to one of the nurses if you have concerns.  [facility], [place] Office: [phone]  For further questions, please visit [email or web address]   Increase activity slowly  As directed    No dressing needed  As directed    Comments:     May shower.       Medication List         acetaminophen 500 MG tablet  Commonly known as:  TYLENOL  Take 1,000 mg by mouth every 6 (six) hours as needed for pain.     HYDROcodone-acetaminophen 5-325 MG per tablet  Commonly known as:  NORCO/VICODIN  Take 1-2 tablets by mouth every 4 (four) hours as needed for pain.     latanoprost 0.005 % ophthalmic solution  Commonly known as:  XALATAN  Place 1 drop into both eyes at bedtime.     meloxicam 15 MG tablet  Commonly known as:  MOBIC  Take 15 mg by mouth daily.     simvastatin 40 MG tablet  Commonly known as:  ZOCOR  Take 40 mg by mouth at bedtime.         Velora Heckler, MD, [facility] Office: [phone]   Signed: Velora Heckler 08/17/2012, 8:01 AM

## 2012-08-17 NOTE — Plan of Care (Signed)
Problem: Discharge Progression Outcomes Goal: Staples/sutures removed Outcome: Not Met (add Reason) To be done in MD follow up visit

## 2012-08-26 ENCOUNTER — Encounter (INDEPENDENT_AMBULATORY_CARE_PROVIDER_SITE_OTHER): Payer: Self-pay | Admitting: Surgery

## 2012-08-26 ENCOUNTER — Ambulatory Visit (INDEPENDENT_AMBULATORY_CARE_PROVIDER_SITE_OTHER): Payer: Federal, State, Local not specified - PPO | Admitting: Surgery

## 2012-08-26 VITALS — BP 138/72 | HR 64 | Resp 16 | Ht 68.5 in | Wt 236.4 lb

## 2012-08-26 DIAGNOSIS — K5730 Diverticulosis of large intestine without perforation or abscess without bleeding: Secondary | ICD-10-CM

## 2012-08-26 NOTE — Patient Instructions (Signed)
  COCOA BUTTER & VITAMIN E CREAM  ([employer] or other brand)  Apply cocoa butter/vitamin E cream to your incision 2 - 3 times daily.  Massage cream into incision for one minute with each application.  Use sunscreen (50 SPF or higher) for first 6 months after surgery if area is exposed to sun.  You may substitute Mederma or other scar reducing creams as desired.   

## 2012-08-26 NOTE — Progress Notes (Signed)
General Surgery [facility]  Chief Complaint  Patient presents with  . Routine Post Op    sigmoid colectomy 08/12/2012    HISTORY: Patient is a 72 year old white male who underwent sigmoid colectomy on 08/12/2012 for chronic diverticular disease and probable chronic appendicitis. Appendectomy was performed concurrently. Postoperative course has been uncomplicated. He is maintaining a low-residue diet according to recommendations from the hospital dietitian. He is anxious to return to physical activity.  EXAM: Abdomen is soft nontender without distention. Midline incision is healed nicely. Staples are removed and benzoin and Steri-Strips are applied. Minimal tenderness. No palpable masses.  IMPRESSION: Status post sigmoid colectomy and appendectomy for chronic diverticulitis and probable chronic appendicitis  PLAN: Patient is instructed in local wound care. He will begin applying topical creams after the Steri-Strips are removed. He is restricted to 25 pounds lifting at this time. He will return for a final wound check in 6 weeks.  Velora Heckler, MD, FACS General & Endocrine Surgery [facility]   Visit Diagnoses: No diagnosis found.

## 2012-10-14 ENCOUNTER — Ambulatory Visit (INDEPENDENT_AMBULATORY_CARE_PROVIDER_SITE_OTHER): Payer: Federal, State, Local not specified - PPO | Admitting: Surgery

## 2012-10-14 ENCOUNTER — Encounter (INDEPENDENT_AMBULATORY_CARE_PROVIDER_SITE_OTHER): Payer: Self-pay | Admitting: Surgery

## 2012-10-14 VITALS — BP 142/78 | HR 60 | Temp 98.0°F | Resp 14 | Ht 68.5 in | Wt 237.4 lb

## 2012-10-14 DIAGNOSIS — K5730 Diverticulosis of large intestine without perforation or abscess without bleeding: Secondary | ICD-10-CM

## 2012-10-14 NOTE — Progress Notes (Signed)
General Surgery [facility]  Chief Complaint  Patient presents with  . 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 Postoperative course was been uncomplicated. He is anxious to return to full activity.  EXAM: Midline abdominal incision is well-healed. No sign of herniation. No sign of infection. Abdomen is soft and nontender without mass.  IMPRESSION: Status post sigmoid colectomy and appendectomy for diverticular disease and chronic perforation of appendix  PLAN: The patient is released to full activity without restriction. His diet is unrestricted.  Patient will return for surgical care as needed.  Velora Heckler, MD, FACS General & Endocrine Surgery [facility]   Visit Diagnoses: 1. 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

## 2012-10-14 NOTE — Patient Instructions (Signed)
  COCOA BUTTER & VITAMIN E CREAM  ([employer] or other brand)  Apply cocoa butter/vitamin E cream to your incision 2 - 3 times daily.  Massage cream into incision for one minute with each application.  Use sunscreen (50 SPF or higher) for first 6 months after surgery if area is exposed to sun.  You may substitute Mederma or other scar reducing creams as desired.   

## 2016-02-03 ENCOUNTER — Other Ambulatory Visit: Payer: Self-pay | Admitting: Orthopedic Surgery

## 2016-04-07 ENCOUNTER — Encounter (HOSPITAL_COMMUNITY): Payer: Self-pay

## 2016-04-07 ENCOUNTER — Encounter (HOSPITAL_COMMUNITY)
Admission: RE | Admit: 2016-04-07 | Discharge: 2016-04-07 | Disposition: A | Discharge status: Home / Self Care | Payer: Federal, State, Local not specified - PPO | Source: Ambulatory Visit | Attending: Orthopedic Surgery | Admitting: Orthopedic Surgery

## 2016-04-07 DIAGNOSIS — M1712 Unilateral primary osteoarthritis, left knee: Secondary | ICD-10-CM | POA: Diagnosis not present

## 2016-04-07 DIAGNOSIS — Z01812 Encounter for preprocedural laboratory examination: Secondary | ICD-10-CM | POA: Insufficient documentation

## 2016-04-07 HISTORY — DX: Personal history of urinary calculi: Z87.442

## 2016-04-07 HISTORY — DX: Cardiac murmur, unspecified: R01.1

## 2016-04-07 LAB — COMPREHENSIVE METABOLIC PANEL
ALBUMIN: 3.9 g/dL (ref 3.5–5.0)
ALT: 20 U/L (ref 17–63)
ANION GAP: 10 (ref 5–15)
AST: 30 U/L (ref 15–41)
Alkaline Phosphatase: 54 U/L (ref 38–126)
BILIRUBIN TOTAL: 1.3 mg/dL — AB (ref 0.3–1.2)
BUN: 10 mg/dL (ref 6–20)
CHLORIDE: 104 mmol/L (ref 101–111)
CO2: 24 mmol/L (ref 22–32)
Calcium: 9.1 mg/dL (ref 8.9–10.3)
Creatinine, Ser: 0.74 mg/dL (ref 0.61–1.24)
GFR calc Af Amer: >60 mL/min (ref >60)
GFR calc non Af Amer: >60 mL/min (ref >60)
GLUCOSE: 166 mg/dL — AB (ref 65–99)
POTASSIUM: 4.1 mmol/L (ref 3.5–5.1)
SODIUM: 138 mmol/L (ref 135–145)
TOTAL PROTEIN: 6.6 g/dL (ref 6.5–8.1)

## 2016-04-07 LAB — CBC WITH DIFFERENTIAL/PLATELET
BASOS ABS: 0 10*3/uL (ref 0.0–0.1)
Basophils Relative: 0 %
EOS ABS: 0.2 10*3/uL (ref 0.0–0.7)
Eosinophils Relative: 2 %
HEMATOCRIT: 46.2 % (ref 39.0–52.0)
Hemoglobin: 15.8 g/dL (ref 13.0–17.0)
LYMPHS ABS: 2.9 10*3/uL (ref 0.7–4.0)
LYMPHS PCT: 24 %
MCH: 31.5 pg (ref 26.0–34.0)
MCHC: 34.2 g/dL (ref 30.0–36.0)
MCV: 92 fL (ref 78.0–100.0)
MONOS PCT: 6 %
Monocytes Absolute: 0.7 10*3/uL (ref 0.1–1.0)
Neutro Abs: 8.1 10*3/uL — ABNORMAL HIGH (ref 1.7–7.7)
Neutrophils Relative %: 68 %
Platelets: 220 10*3/uL (ref 150–400)
RBC: 5.02 MIL/uL (ref 4.22–5.81)
RDW: 13.7 % (ref 11.5–15.5)
WBC: 11.9 10*3/uL — AB (ref 4.0–10.5)

## 2016-04-07 LAB — SURGICAL PCR SCREEN
MRSA, PCR: NEGATIVE
STAPHYLOCOCCUS AUREUS: NEGATIVE

## 2016-04-07 NOTE — Progress Notes (Signed)
   04/07/16 1441  OBSTRUCTIVE SLEEP APNEA  Have you ever been diagnosed with sleep apnea through a sleep study? No  Do you snore loudly (loud enough to be heard through closed doors)?  1  Do you often feel tired, fatigued, or sleepy during the daytime (such as falling asleep during driving or talking to someone)? 0  Has anyone observed you stop breathing during your sleep? 1  Do you have, or are you being treated for high blood pressure? 0  BMI more than 35 kg/m2? 1  Age > 50 (1-yes) 1  Neck circumference greater than:Male 16 inches or larger, Male 17inches or larger? 1 (20.5)  Male Gender (Yes=1) 1  Obstructive Sleep Apnea Score 6  Score 5 or greater  Results sent to PCP

## 2016-04-07 NOTE — Pre-Procedure Instructions (Addendum)
Victor [name]  04/07/2016      [facility] - [place], [place] - [address] [facility] [place] [address] Phone: [phone] Fax: [phone]    Your procedure is scheduled on *04/17/16  Report to [facility] Admitting at 530 A.M.  Call this number if you have problems the morning of surgery:  [phone]   Remember:  Do not eat food or drink liquids after midnight.  Take these medicines the morning of surgery with A SIP OF WATER    Eye drops, tylenol if needed  STOP all herbel meds, nsaids (aleve,naproxen,advil,ibuprofen)starting Today 04/07/16 including all vitamins/supplements , aspirin, celebrex   Do not wear jewelry, make-up or nail polish.  Do not wear lotions, powders, or perfumes, or deoderant.  Do not shave 48 hours prior to surgery.  Men may shave face and neck.  Do not bring valuables to the hospital.  [facility] is not responsible for any belongings or valuables.  Contacts, dentures or bridgework may not be worn into surgery.  Leave your suitcase in the car.  After surgery it may be brought to your room.  For patients admitted to the hospital, discharge time will be determined by your treatment team.  Patients discharged the day of surgery will not be allowed to drive home.   Special instructions:   Special Instructions: Victor [name] - Preparing for Surgery  Before surgery, you can play an important role.  Because skin is not sterile, your skin needs to be as free of germs as possible.  You can reduce the number of germs on you skin by washing with CHG (chlorahexidine gluconate) soap before surgery.  CHG is an antiseptic cleaner which kills germs and bonds with the skin to continue killing germs even after washing.  Please DO NOT use if you have an allergy to CHG or antibacterial soaps.  If your skin becomes reddened/irritated stop using the CHG and inform your nurse when you arrive at Short Stay.  Do  not shave (including legs and underarms) for at least 48 hours prior to the first CHG shower.  You may shave your face.  Please follow these instructions carefully:   1.  Shower with CHG Soap the night before surgery and the morning of Surgery.  2.  If you choose to wash your hair, wash your hair first as usual with your normal shampoo.  3.  After you shampoo, rinse your hair and body thoroughly to remove the Shampoo.  4.  Use CHG as you would any other liquid soap.  You can apply chg directly  to the skin and wash gently with scrungie or a clean washcloth.  5.  Apply the CHG Soap to your body ONLY FROM THE NECK DOWN.  Do not use on open wounds or open sores.  Avoid contact with your eyes ears, mouth and genitals (private parts).  Wash genitals (private parts)       with your normal soap.  6.  Wash thoroughly, paying special attention to the area where your surgery will be performed.  7.  Thoroughly rinse your body with warm water from the neck down.  8.  DO NOT shower/wash with your normal soap after using and rinsing off the CHG Soap.  9.  Pat yourself dry with a clean towel.            10.  Wear clean pajamas.  11.  Place clean sheets on your bed the night of your first shower and do not sleep with pets.  Day of Surgery  Do not apply any lotions/deodorants the morning of surgery.  Please wear clean clothes to the hospital/surgery center.  Please read over the fact sheets that you were given.

## 2016-04-14 MED ORDER — TRANEXAMIC ACID 1000 MG/10ML IV SOLN
1000.0000 mg | INTRAVENOUS | Status: AC
  Administered 2016-04-17: 1000 mg via INTRAVENOUS
  Filled 2016-04-14: qty 10

## 2016-04-14 MED ORDER — SODIUM CHLORIDE 0.9 % IV SOLN: INTRAVENOUS | Status: DC

## 2016-04-14 MED ORDER — DEXTROSE 5 % IV SOLN
3.0000 g | INTRAVENOUS | Status: AC
  Administered 2016-04-17: 3 g via INTRAVENOUS
  Filled 2016-04-14: qty 3000

## 2016-04-14 MED ORDER — BUPIVACAINE LIPOSOME 1.3 % IJ SUSP
20.0000 mL | INTRAMUSCULAR | Status: AC
  Administered 2016-04-17: 20 mL
  Filled 2016-04-14: qty 20

## 2016-04-14 MED ORDER — DEXAMETHASONE SODIUM PHOSPHATE 10 MG/ML IJ SOLN
8.0000 mg | INTRAMUSCULAR | Status: AC
  Administered 2016-04-17: 8 mg via INTRAVENOUS
  Filled 2016-04-14: qty 1

## 2016-04-14 MED ORDER — GABAPENTIN 300 MG PO CAPS
300.0000 mg | ORAL_CAPSULE | ORAL | Status: AC
  Administered 2016-04-17: 300 mg via ORAL
  Filled 2016-04-14: qty 1

## 2016-04-16 NOTE — H&P (Signed)
Victor [name] MRN:  [id number] DOB/SEX:  [birth year]/male  CHIEF COMPLAINT:  Painful left Knee  HISTORY: Patient is a 76 y.o. male presented with a history of pain in the left knee. Onset of symptoms was gradual starting a few years ago with gradually worsening course since that time. Patient has been treated conservatively with over-the-counter NSAIDs and activity modification. Patient currently rates pain in the knee at 10 out of 10 with activity. There is pain at night.  PAST MEDICAL HISTORY: Patient Active Problem List   Diagnosis Date Noted  . 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 Arthritis   . Diverticulitis   . Geographic tongue   . Heart murmur    child  . History of kidney stones   . Hyperlipidemia   . Multiple allergies   . Restless leg syndrome   . Scoliosis    "adult"  . Stenosis of lumbosacral spine    Past Surgical History:  Procedure Laterality Date  . APPENDECTOMY  as child  . APPENDECTOMY N/A 08/12/2012   Procedure: APPENDECTOMY;  Surgeon: Velora Heckler, MD;  Location: [facility]S;  Service: General;  Laterality: N/A;  . CHOLECYSTECTOMY    . COLONOSCOPY     many  . cyst removed Left    wrist  . CYSTOSCOPY  40 years ago  . KNEE ARTHROSCOPY Left   . PARTIAL COLECTOMY N/A 08/12/2012   Procedure:  SIGMOID COLECTOMY;  Surgeon: Velora Heckler, MD;  Location: [facility]S;  Service: General;  Laterality: N/A;  . ROTATOR CUFF REPAIR     bilateral   . SHOULDER SURGERY Left   . TONSILLECTOMY       MEDICATIONS:   No prescriptions prior to admission.    ALLERGIES:   Allergies  Allergen Reactions  . Hydrocodone-Acetaminophen Itching    REVIEW OF SYSTEMS:  A comprehensive review of systems was negative except for: Musculoskeletal: positive for arthralgias and bone pain   FAMILY HISTORY:   Family History  Problem Relation Age of Onset  . Heart disease Mother   . Cancer Father     colon & lung  .  Cancer Maternal Grandfather     bladder    SOCIAL HISTORY:   Social History  Substance Use Topics  . Smoking status: Never Smoker  . Smokeless tobacco: Never Used  . Alcohol use No     EXAMINATION:  Vital signs in last 24 hours:    There were no vitals taken for this visit.  General Appearance:    Alert, cooperative, no distress, appears stated age  Head:    Normocephalic, without obvious abnormality, atraumatic  Eyes:    PERRL, conjunctiva/corneas clear, EOM's intact, fundi    benign, both eyes       Ears:    Normal TM's and external ear canals, both ears  Nose:   Nares normal, septum midline, mucosa normal, no drainage    or sinus tenderness  Throat:   Lips, mucosa, and tongue normal; teeth and gums normal  Neck:   Supple, symmetrical, trachea midline, no adenopathy;       thyroid:  No enlargement/tenderness/nodules; no carotid   bruit or JVD  Back:     Symmetric, no curvature, ROM normal, no CVA tenderness  Lungs:     Clear to auscultation bilaterally, respirations unlabored  Chest wall:    No tenderness or deformity  Heart:    Regular rate and rhythm, S1 and S2  normal, no murmur, rub   or gallop  Abdomen:     Soft, non-tender, bowel sounds active all four quadrants,    no masses, no organomegaly  Genitalia:    Normal male without lesion, discharge or tenderness  Rectal:    Normal tone, normal prostate, no masses or tenderness;   guaiac negative stool  Extremities:   Extremities normal, atraumatic, no cyanosis or edema  Pulses:   2+ and symmetric all extremities  Skin:   Skin color, texture, turgor normal, no rashes or lesions  Lymph nodes:   Cervical, supraclavicular, and axillary nodes normal  Neurologic:   CNII-XII intact. Normal strength, sensation and reflexes      throughout    Musculoskeletal:  ROM 0-120, Ligaments intact,  Imaging Review Plain radiographs demonstrate severe degenerative joint disease of the left knee. The overall alignment is neutral. The  bone quality appears to be good for age and reported activity level.  Assessment/Plan: Primary osteoarthritis, left knee   The patient history, physical examination and imaging studies are consistent with advanced degenerative joint disease of the left knee. The patient has failed conservative treatment.  The clearance notes were reviewed.  After discussion with the patient it was felt that Total Knee Replacement was indicated. The procedure,  risks, and benefits of total knee arthroplasty were presented and reviewed. The risks including but not limited to aseptic loosening, infection, blood clots, vascular injury, stiffness, patella tracking problems complications among others were discussed. The patient acknowledged the explanation, agreed to proceed with the plan.  Guy Sandifer 04/16/2016, 8:43 PM

## 2016-04-17 ENCOUNTER — Encounter (HOSPITAL_COMMUNITY)
Admission: RE | Disposition: A | Discharge status: Home Health | Payer: Self-pay | Source: Ambulatory Visit | Attending: Orthopedic Surgery

## 2016-04-17 ENCOUNTER — Inpatient Hospital Stay (HOSPITAL_COMMUNITY): Payer: Federal, State, Local not specified - PPO | Admitting: Certified Registered"

## 2016-04-17 ENCOUNTER — Observation Stay (HOSPITAL_COMMUNITY)
Admission: RE | Admit: 2016-04-17 | Discharge: 2016-04-18 | Disposition: A | Discharge status: Home Health | Payer: Federal, State, Local not specified - PPO | Source: Ambulatory Visit | Attending: Orthopedic Surgery | Admitting: Orthopedic Surgery

## 2016-04-17 ENCOUNTER — Encounter (HOSPITAL_COMMUNITY): Payer: Self-pay | Admitting: Certified Registered"

## 2016-04-17 DIAGNOSIS — R2681 Unsteadiness on feet: Secondary | ICD-10-CM | POA: Insufficient documentation

## 2016-04-17 DIAGNOSIS — E785 Hyperlipidemia, unspecified: Secondary | ICD-10-CM | POA: Insufficient documentation

## 2016-04-17 DIAGNOSIS — M1712 Unilateral primary osteoarthritis, left knee: Secondary | ICD-10-CM | POA: Diagnosis present

## 2016-04-17 DIAGNOSIS — Z96659 Presence of unspecified artificial knee joint: Secondary | ICD-10-CM

## 2016-04-17 DIAGNOSIS — Z87442 Personal history of urinary calculi: Secondary | ICD-10-CM | POA: Diagnosis not present

## 2016-04-17 DIAGNOSIS — Z7982 Long term (current) use of aspirin: Secondary | ICD-10-CM | POA: Insufficient documentation

## 2016-04-17 DIAGNOSIS — K5730 Diverticulosis of large intestine without perforation or abscess without bleeding: Secondary | ICD-10-CM | POA: Diagnosis not present

## 2016-04-17 DIAGNOSIS — M419 Scoliosis, unspecified: Secondary | ICD-10-CM | POA: Diagnosis not present

## 2016-04-17 DIAGNOSIS — G2581 Restless legs syndrome: Secondary | ICD-10-CM | POA: Insufficient documentation

## 2016-04-17 DIAGNOSIS — M6281 Muscle weakness (generalized): Secondary | ICD-10-CM | POA: Diagnosis not present

## 2016-04-17 HISTORY — DX: Presence of unspecified artificial knee joint: Z96.659

## 2016-04-17 HISTORY — PX: TOTAL KNEE ARTHROPLASTY: SHX125

## 2016-04-17 SURGERY — ARTHROPLASTY, KNEE, TOTAL
Anesthesia: Spinal | Site: Knee | Laterality: Left

## 2016-04-17 MED ORDER — BUPIVACAINE-EPINEPHRINE (PF) 0.5% -1:200000 IJ SOLN
INTRAMUSCULAR | Status: DC | PRN
  Administered 2016-04-17: 25 mL via PERINEURAL

## 2016-04-17 MED ORDER — SODIUM CHLORIDE 0.9 % IV SOLN
INTRAVENOUS | Status: DC
  Administered 2016-04-17: 14:00:00 via INTRAVENOUS

## 2016-04-17 MED ORDER — FLEET ENEMA 7-19 GM/118ML RE ENEM
1.0000 | ENEMA | Freq: Once | RECTAL | Status: DC | PRN
Start: 2016-04-17 — End: 2016-04-18

## 2016-04-17 MED ORDER — SODIUM CHLORIDE 0.9 % IJ SOLN
INTRAMUSCULAR | Status: DC | PRN
  Administered 2016-04-17: 20 mL via INTRAVENOUS

## 2016-04-17 MED ORDER — GABAPENTIN 300 MG PO CAPS
300.0000 mg | ORAL_CAPSULE | Freq: Three times a day (TID) | ORAL | Status: DC
  Administered 2016-04-17 – 2016-04-18 (×3): 300 mg via ORAL
  Filled 2016-04-17 (×3): qty 1

## 2016-04-17 MED ORDER — TRANEXAMIC ACID 1000 MG/10ML IV SOLN
1000.0000 mg | Freq: Once | INTRAVENOUS | Status: AC
  Administered 2016-04-17: 1000 mg via INTRAVENOUS
  Filled 2016-04-17: qty 10

## 2016-04-17 MED ORDER — BUPIVACAINE HCL (PF) 0.25 % IJ SOLN
INTRAMUSCULAR | Status: AC
  Filled 2016-04-17: qty 30

## 2016-04-17 MED ORDER — BRIMONIDINE TARTRATE-TIMOLOL 0.2-0.5 % OP SOLN: 1.0000 [drp] | Freq: Two times a day (BID) | OPHTHALMIC | Status: DC

## 2016-04-17 MED ORDER — METHOCARBAMOL 500 MG PO TABS
500.0000 mg | ORAL_TABLET | Freq: Four times a day (QID) | ORAL | Status: DC | PRN
  Administered 2016-04-17 – 2016-04-18 (×2): 500 mg via ORAL
  Filled 2016-04-17 (×2): qty 1

## 2016-04-17 MED ORDER — SIMVASTATIN 40 MG PO TABS
40.0000 mg | ORAL_TABLET | Freq: Every day | ORAL | Status: DC
  Administered 2016-04-17: 40 mg via ORAL
  Filled 2016-04-17: qty 1

## 2016-04-17 MED ORDER — EPINEPHRINE PF 1 MG/ML IJ SOLN
INTRAMUSCULAR | Status: AC
  Filled 2016-04-17: qty 1

## 2016-04-17 MED ORDER — ASPIRIN EC 325 MG PO TBEC
325.0000 mg | DELAYED_RELEASE_TABLET | Freq: Two times a day (BID) | ORAL | Status: DC
  Administered 2016-04-17 – 2016-04-18 (×3): 325 mg via ORAL
  Filled 2016-04-17 (×3): qty 1

## 2016-04-17 MED ORDER — ZOLPIDEM TARTRATE 5 MG PO TABS: 5.0000 mg | ORAL_TABLET | Freq: Every evening | ORAL | Status: DC | PRN

## 2016-04-17 MED ORDER — LACTATED RINGERS IV SOLN
INTRAVENOUS | Status: DC | PRN
  Administered 2016-04-17 (×2): via INTRAVENOUS

## 2016-04-17 MED ORDER — DOCUSATE SODIUM 100 MG PO CAPS
100.0000 mg | ORAL_CAPSULE | Freq: Two times a day (BID) | ORAL | Status: DC
  Administered 2016-04-17 – 2016-04-18 (×3): 100 mg via ORAL
  Filled 2016-04-17 (×3): qty 1

## 2016-04-17 MED ORDER — ONDANSETRON HCL 4 MG/2ML IJ SOLN: 4.0000 mg | Freq: Four times a day (QID) | INTRAMUSCULAR | Status: DC | PRN

## 2016-04-17 MED ORDER — FENTANYL CITRATE (PF) 100 MCG/2ML IJ SOLN
INTRAMUSCULAR | Status: DC | PRN
  Administered 2016-04-17: 100 ug via INTRAVENOUS

## 2016-04-17 MED ORDER — PHENYLEPHRINE HCL 10 MG/ML IJ SOLN
INTRAMUSCULAR | Status: DC | PRN
  Administered 2016-04-17 (×3): 40 ug via INTRAVENOUS
  Administered 2016-04-17: 80 ug via INTRAVENOUS
  Administered 2016-04-17: 40 ug via INTRAVENOUS

## 2016-04-17 MED ORDER — ALUM & MAG HYDROXIDE-SIMETH 200-200-20 MG/5ML PO SUSP: 30.0000 mL | ORAL | Status: DC | PRN

## 2016-04-17 MED ORDER — FENTANYL CITRATE (PF) 250 MCG/5ML IJ SOLN
INTRAMUSCULAR | Status: AC
  Filled 2016-04-17: qty 5

## 2016-04-17 MED ORDER — METOCLOPRAMIDE HCL 5 MG PO TABS: 5.0000 mg | ORAL_TABLET | Freq: Three times a day (TID) | ORAL | Status: DC | PRN

## 2016-04-17 MED ORDER — DEXAMETHASONE SODIUM PHOSPHATE 10 MG/ML IJ SOLN
10.0000 mg | Freq: Once | INTRAMUSCULAR | Status: AC
  Administered 2016-04-18: 10 mg via INTRAVENOUS
  Filled 2016-04-17: qty 1

## 2016-04-17 MED ORDER — METOCLOPRAMIDE HCL 5 MG/ML IJ SOLN: 5.0000 mg | Freq: Three times a day (TID) | INTRAMUSCULAR | Status: DC | PRN

## 2016-04-17 MED ORDER — TIMOLOL MALEATE 0.5 % OP SOLN
1.0000 [drp] | Freq: Two times a day (BID) | OPHTHALMIC | Status: DC
  Administered 2016-04-18: 1 [drp] via OPHTHALMIC
  Filled 2016-04-17: qty 5

## 2016-04-17 MED ORDER — MIDAZOLAM HCL 2 MG/2ML IJ SOLN
INTRAMUSCULAR | Status: AC
  Filled 2016-04-17: qty 2

## 2016-04-17 MED ORDER — METHOCARBAMOL 1000 MG/10ML IJ SOLN
500.0000 mg | Freq: Four times a day (QID) | INTRAVENOUS | Status: DC | PRN
  Filled 2016-04-17: qty 5

## 2016-04-17 MED ORDER — CEFAZOLIN SODIUM-DEXTROSE 2-4 GM/100ML-% IV SOLN
2.0000 g | Freq: Four times a day (QID) | INTRAVENOUS | Status: AC
  Administered 2016-04-17 (×2): 2 g via INTRAVENOUS
  Filled 2016-04-17 (×2): qty 100

## 2016-04-17 MED ORDER — BUPIVACAINE HCL (PF) 0.5 % IJ SOLN
INTRAMUSCULAR | Status: DC | PRN
  Administered 2016-04-17: 3 mL via INTRATHECAL

## 2016-04-17 MED ORDER — MIDAZOLAM HCL 2 MG/2ML IJ SOLN
INTRAMUSCULAR | Status: DC | PRN
  Administered 2016-04-17: 2 mg via INTRAVENOUS

## 2016-04-17 MED ORDER — BUPIVACAINE-EPINEPHRINE (PF) 0.25% -1:200000 IJ SOLN
INTRAMUSCULAR | Status: DC | PRN
  Administered 2016-04-17: 20 mL via PERINEURAL

## 2016-04-17 MED ORDER — SODIUM CHLORIDE 0.9 % IR SOLN
Status: DC | PRN
  Administered 2016-04-17: 3000 mL

## 2016-04-17 MED ORDER — PROPOFOL 500 MG/50ML IV EMUL
INTRAVENOUS | Status: DC | PRN
  Administered 2016-04-17: 50 ug/kg/min via INTRAVENOUS

## 2016-04-17 MED ORDER — DIPHENHYDRAMINE HCL 12.5 MG/5ML PO ELIX
12.5000 mg | ORAL_SOLUTION | ORAL | Status: DC | PRN
  Administered 2016-04-17 – 2016-04-18 (×3): 25 mg via ORAL
  Filled 2016-04-17 (×4): qty 10

## 2016-04-17 MED ORDER — HYDROCODONE-ACETAMINOPHEN 7.5-325 MG PO TABS
1.0000 | ORAL_TABLET | Freq: Four times a day (QID) | ORAL | Status: DC
Start: 2016-04-17 — End: 2016-04-18
  Administered 2016-04-17 – 2016-04-18 (×2): 1 via ORAL
  Filled 2016-04-17 (×2): qty 1

## 2016-04-17 MED ORDER — BISACODYL 5 MG PO TBEC: 5.0000 mg | DELAYED_RELEASE_TABLET | Freq: Every day | ORAL | Status: DC | PRN

## 2016-04-17 MED ORDER — FENTANYL CITRATE (PF) 100 MCG/2ML IJ SOLN: 25.0000 ug | INTRAMUSCULAR | Status: DC | PRN

## 2016-04-17 MED ORDER — ONDANSETRON HCL 4 MG PO TABS: 4.0000 mg | ORAL_TABLET | Freq: Four times a day (QID) | ORAL | Status: DC | PRN

## 2016-04-17 MED ORDER — LATANOPROST 0.005 % OP SOLN
1.0000 [drp] | Freq: Once | OPHTHALMIC | Status: AC
  Administered 2016-04-17: 1 [drp] via OPHTHALMIC
  Filled 2016-04-17: qty 2.5

## 2016-04-17 MED ORDER — OXYCODONE HCL 5 MG PO TABS: 5.0000 mg | ORAL_TABLET | Freq: Once | ORAL | Status: DC | PRN

## 2016-04-17 MED ORDER — EPHEDRINE 5 MG/ML INJ
INTRAVENOUS | Status: AC
  Filled 2016-04-17: qty 10

## 2016-04-17 MED ORDER — CELECOXIB 200 MG PO CAPS
200.0000 mg | ORAL_CAPSULE | Freq: Two times a day (BID) | ORAL | Status: DC
  Administered 2016-04-17 – 2016-04-18 (×2): 200 mg via ORAL
  Filled 2016-04-17 (×2): qty 1

## 2016-04-17 MED ORDER — MENTHOL 3 MG MT LOZG: 1.0000 | LOZENGE | OROMUCOSAL | Status: DC | PRN

## 2016-04-17 MED ORDER — SENNOSIDES-DOCUSATE SODIUM 8.6-50 MG PO TABS: 1.0000 | ORAL_TABLET | Freq: Every evening | ORAL | Status: DC | PRN

## 2016-04-17 MED ORDER — HYDROMORPHONE HCL 1 MG/ML IJ SOLN
1.0000 mg | INTRAMUSCULAR | Status: DC | PRN
  Administered 2016-04-17: 1 mg via INTRAVENOUS
  Filled 2016-04-17 (×2): qty 1

## 2016-04-17 MED ORDER — BUPIVACAINE-EPINEPHRINE 0.5% -1:200000 IJ SOLN: INTRAMUSCULAR | Status: DC | PRN

## 2016-04-17 MED ORDER — OXYCODONE HCL 5 MG/5ML PO SOLN: 5.0000 mg | Freq: Once | ORAL | Status: DC | PRN

## 2016-04-17 MED ORDER — BRIMONIDINE TARTRATE 0.2 % OP SOLN
1.0000 [drp] | Freq: Two times a day (BID) | OPHTHALMIC | Status: DC
  Administered 2016-04-18: 1 [drp] via OPHTHALMIC
  Filled 2016-04-17: qty 5

## 2016-04-17 MED ORDER — CHLORHEXIDINE GLUCONATE 4 % EX LIQD: 60.0000 mL | Freq: Once | CUTANEOUS | Status: DC

## 2016-04-17 MED ORDER — EPHEDRINE SULFATE 50 MG/ML IJ SOLN
INTRAMUSCULAR | Status: DC | PRN
  Administered 2016-04-17 (×2): 10 mg via INTRAVENOUS
  Administered 2016-04-17: 5 mg via INTRAVENOUS

## 2016-04-17 MED ORDER — 0.9 % SODIUM CHLORIDE (POUR BTL) OPTIME
TOPICAL | Status: DC | PRN
  Administered 2016-04-17: 1000 mL

## 2016-04-17 MED ORDER — OXYCODONE HCL 5 MG PO TABS
5.0000 mg | ORAL_TABLET | ORAL | Status: DC | PRN
  Administered 2016-04-17 – 2016-04-18 (×5): 10 mg via ORAL
  Filled 2016-04-17 (×5): qty 2

## 2016-04-17 MED ORDER — PHENOL 1.4 % MT LIQD: 1.0000 | OROMUCOSAL | Status: DC | PRN

## 2016-04-17 MED ORDER — PHENYLEPHRINE 40 MCG/ML (10ML) SYRINGE FOR IV PUSH (FOR BLOOD PRESSURE SUPPORT)
PREFILLED_SYRINGE | INTRAVENOUS | Status: AC
  Filled 2016-04-17: qty 10

## 2016-04-17 SURGICAL SUPPLY — 64 items
BANDAGE ACE 6X5 VEL STRL LF (GAUZE/BANDAGES/DRESSINGS) ×2 IMPLANT
BANDAGE ESMARK 6X9 LF (GAUZE/BANDAGES/DRESSINGS) ×1 IMPLANT
BLADE PATELLA REAM PILOT HOLE (BLADE) ×2 IMPLANT
BLADE SAGITTAL 13X1.27X60 (BLADE) ×2 IMPLANT
BLADE SAW SGTL 83.5X18.5 (BLADE) ×2 IMPLANT
BLADE SURG 10 STRL SS (BLADE) ×2 IMPLANT
BNDG ESMARK 6X9 LF (GAUZE/BANDAGES/DRESSINGS) ×2
BOWL SMART MIX CTS (DISPOSABLE) ×2 IMPLANT
CAPT KNEE TOTAL 3 ×2 IMPLANT
CEMENT BONE SIMPLEX SPEEDSET (Cement) ×4 IMPLANT
COVER SURGICAL LIGHT HANDLE (MISCELLANEOUS) ×2 IMPLANT
CUFF TOURNIQUET SINGLE 34IN LL (TOURNIQUET CUFF) ×2 IMPLANT
DRAPE EXTREMITY T 121X128X90 (DRAPE) ×2 IMPLANT
DRAPE HALF SHEET 40X57 (DRAPES) ×2 IMPLANT
DRAPE INCISE IOBAN 66X45 STRL (DRAPES) ×4 IMPLANT
DRAPE U-SHAPE 47X51 STRL (DRAPES) ×2 IMPLANT
DRESSING AQUACEL AG SP 3.5X10 (GAUZE/BANDAGES/DRESSINGS) ×1 IMPLANT
DRSG AQUACEL AG ADV 3.5X10 (GAUZE/BANDAGES/DRESSINGS) ×2 IMPLANT
DRSG AQUACEL AG SP 3.5X10 (GAUZE/BANDAGES/DRESSINGS) ×2
DURAPREP 26ML APPLICATOR (WOUND CARE) ×4 IMPLANT
ELECT REM PT RETURN 9FT ADLT (ELECTROSURGICAL) ×2
ELECTRODE REM PT RTRN 9FT ADLT (ELECTROSURGICAL) ×1 IMPLANT
FILTER STRAW FLUID ASPIR (MISCELLANEOUS) IMPLANT
GLOVE BIOGEL M 7.0 STRL (GLOVE) IMPLANT
GLOVE BIOGEL PI IND STRL 6.5 (GLOVE) ×1 IMPLANT
GLOVE BIOGEL PI IND STRL 7.5 (GLOVE) IMPLANT
GLOVE BIOGEL PI IND STRL 8.5 (GLOVE) ×1 IMPLANT
GLOVE BIOGEL PI INDICATOR 6.5 (GLOVE) ×1
GLOVE BIOGEL PI INDICATOR 7.5 (GLOVE)
GLOVE BIOGEL PI INDICATOR 8.5 (GLOVE) ×1
GLOVE SURG ORTHO 8.0 STRL STRW (GLOVE) ×4 IMPLANT
GLOVE SURG SS PI 6.5 STRL IVOR (GLOVE) ×2 IMPLANT
GOWN STRL REUS W/ TWL LRG LVL3 (GOWN DISPOSABLE) ×1 IMPLANT
GOWN STRL REUS W/ TWL XL LVL3 (GOWN DISPOSABLE) ×2 IMPLANT
GOWN STRL REUS W/TWL 2XL LVL3 (GOWN DISPOSABLE) ×2 IMPLANT
GOWN STRL REUS W/TWL LRG LVL3 (GOWN DISPOSABLE) ×1
GOWN STRL REUS W/TWL XL LVL3 (GOWN DISPOSABLE) ×2
HANDPIECE INTERPULSE COAX TIP (DISPOSABLE) ×2
HOOD PEEL AWAY FACE SHEILD DIS (HOOD) ×6 IMPLANT
KIT BASIN OR (CUSTOM PROCEDURE TRAY) ×2 IMPLANT
KIT ROOM TURNOVER OR (KITS) ×2 IMPLANT
KNEE CAPITATED TOTAL 3 ×1 IMPLANT
MANIFOLD NEPTUNE II (INSTRUMENTS) ×2 IMPLANT
NEEDLE 18GX1X1/2 (RX/OR ONLY) (NEEDLE) IMPLANT
NEEDLE 22X1 1/2 (OR ONLY) (NEEDLE) ×4 IMPLANT
NS IRRIG 1000ML POUR BTL (IV SOLUTION) ×2 IMPLANT
PACK TOTAL JOINT (CUSTOM PROCEDURE TRAY) ×2 IMPLANT
PAD ARMBOARD 7.5X6 YLW CONV (MISCELLANEOUS) ×4 IMPLANT
SET HNDPC FAN SPRY TIP SCT (DISPOSABLE) ×1 IMPLANT
STRIP CLOSURE SKIN 1/2X4 (GAUZE/BANDAGES/DRESSINGS) ×2 IMPLANT
SUCTION FRAZIER HANDLE 10FR (MISCELLANEOUS)
SUCTION TUBE FRAZIER 10FR DISP (MISCELLANEOUS) IMPLANT
SUT MNCRL AB 3-0 PS2 18 (SUTURE) ×2 IMPLANT
SUT VIC AB 0 CTB1 27 (SUTURE) ×4 IMPLANT
SUT VIC AB 1 CT1 27 (SUTURE) ×4
SUT VIC AB 1 CT1 27XBRD ANBCTR (SUTURE) ×2 IMPLANT
SUT VIC AB 2-0 CT1 27 (SUTURE) ×2
SUT VIC AB 2-0 CT1 TAPERPNT 27 (SUTURE) ×2 IMPLANT
SYR 20CC LL (SYRINGE) ×4 IMPLANT
SYR TB 1ML LUER SLIP (SYRINGE) IMPLANT
TOWEL OR 17X24 6PK STRL BLUE (TOWEL DISPOSABLE) ×2 IMPLANT
TOWEL OR 17X26 10 PK STRL BLUE (TOWEL DISPOSABLE) ×2 IMPLANT
TRAY CATH 16FR W/PLASTIC CATH (SET/KITS/TRAYS/PACK) IMPLANT
WRAP KNEE MAXI GEL POST OP (GAUZE/BANDAGES/DRESSINGS) ×2 IMPLANT

## 2016-04-17 NOTE — Anesthesia Procedure Notes (Signed)
Spinal  Patient location during procedure: OR Start time: 04/17/2016 7:34 AM End time: 04/17/2016 7:40 AM Staffing Anesthesiologist: Chaney Malling, Kymani Shimabukuro Performed: anesthesiologist  Preanesthetic Checklist Completed: patient identified, surgical consent, pre-op evaluation, timeout performed, IV checked, risks and benefits discussed and monitors and equipment checked Spinal Block Patient position: sitting Prep: DuraPrep Patient monitoring: cardiac monitor, continuous pulse ox and blood pressure Approach: midline Location: L3-4 Injection technique: single-shot Needle Needle type: Pencan  Needle gauge: 24 G Needle length: 9 cm Assessment Sensory level: T10 Additional Notes Functioning IV was confirmed and monitors were applied. Sterile prep and drape, including hand hygiene and sterile gloves were used. The patient was positioned and the spine was prepped. The skin was anesthetized with lidocaine.  Free flow of clear CSF was obtained prior to injecting local anesthetic into the CSF.  The spinal needle aspirated freely following injection.  The needle was carefully withdrawn.  The patient tolerated the procedure well.

## 2016-04-17 NOTE — Anesthesia Postprocedure Evaluation (Signed)
Anesthesia Post Note  Patient: Victor [name]  Procedure(s) Performed: Procedure(s) (LRB): LEFT TOTAL KNEE ARTHROPLASTY (Left)  Patient location during evaluation: PACU Anesthesia Type: Spinal Level of consciousness: oriented and awake and alert Pain management: pain level controlled Vital Signs Assessment: post-procedure vital signs reviewed and stable Respiratory status: spontaneous breathing, respiratory function stable and patient connected to nasal cannula oxygen Cardiovascular status: blood pressure returned to baseline and stable Postop Assessment: no headache and no backache Anesthetic complications: no       Last Vitals:  Vitals:   04/17/16 1030 04/17/16 1035  BP:  (!) 142/83  Pulse: 72 65  Resp: 18 10  Temp:      Last Pain: There were no vitals filed for this visit.               Ashni Lonzo S

## 2016-04-17 NOTE — Transfer of Care (Signed)
Immediate Anesthesia Transfer of Care Note  Patient: Victor [name]  Procedure(s) Performed: Procedure(s): LEFT TOTAL KNEE ARTHROPLASTY (Left)  Patient Location: PACU  Anesthesia Type:Spinal  Level of Consciousness: awake, alert  and oriented  Airway & Oxygen Therapy: Patient Spontanous Breathing and Patient connected to nasal cannula oxygen  Post-op Assessment: Report given to RN  Post vital signs: Reviewed and stable  Last Vitals:  Vitals:   04/17/16 0645  BP: (!) 162/77  Pulse: 74  Resp: 20  Temp: 36.8 C    Last Pain: There were no vitals filed for this visit.       Complications: No apparent anesthesia complications

## 2016-04-17 NOTE — Progress Notes (Signed)
Orthopedic Tech Progress Note Patient Details:  Victor [name] 1940/05/19 [id number]  CPM Left Knee CPM Left Knee: On Left Knee Flexion (Degrees): 90 Left Knee Extension (Degrees): 0 Additional Comments: foot roll   [name] 04/17/2016, 11:10 AM

## 2016-04-17 NOTE — Anesthesia Preprocedure Evaluation (Signed)
Anesthesia Evaluation  Patient identified by MRN, date of birth, ID band Patient awake    Reviewed: Allergy & Precautions, H&P , NPO status , Patient's Chart, lab work & pertinent test results  Airway Mallampati: II   Neck ROM: full    Dental   Pulmonary neg pulmonary ROS,    breath sounds clear to auscultation       Cardiovascular negative cardio ROS   Rhythm:regular Rate:Normal     Neuro/Psych    GI/Hepatic   Endo/Other    Renal/GU      Musculoskeletal  (+) Arthritis ,   Abdominal   Peds  Hematology   Anesthesia Other Findings   Reproductive/Obstetrics                             Anesthesia Physical Anesthesia Plan  ASA: II  Anesthesia Plan: Spinal   Post-op Pain Management:  Regional for Post-op pain   Induction: Intravenous  Airway Management Planned: Simple Face Mask  Additional Equipment:   Intra-op Plan:   Post-operative Plan:   Informed Consent: I have reviewed the patients History and Physical, chart, labs and discussed the procedure including the risks, benefits and alternatives for the proposed anesthesia with the patient or authorized representative who has indicated his/her understanding and acceptance.     Plan Discussed with: CRNA, Anesthesiologist and Surgeon  Anesthesia Plan Comments:         Anesthesia Quick Evaluation

## 2016-04-17 NOTE — Evaluation (Signed)
Physical Therapy Evaluation Patient Details Name: Victor [name] MRN: [id number] DOB: [birth year] Today's Date: 04/17/2016   History of Present Illness  Admitted for LTKA, [name];  has a pertinent  past medical history of Arthritis; Scoliosis; and Stenosis of lumbosacral spine.  has a pertinent past surgical history that includes  Knee arthroscopy (Left); Rotator cuff repair; Shoulder surgery (Left)  Clinical Impression  Pt is s/p TKA resulting in the deficits listed below (see PT Problem List). Overall moving well; Will likely be able to dc home tomorrow; may only need one session;  Pt will benefit from skilled PT to increase their independence and safety with mobility to allow discharge to the venue listed below.      Follow Up Recommendations Home health PT;Supervision/Assistance - 24 hour    Equipment Recommendations  Rolling walker with 5" wheels;3in1 (PT) (May already have)    Recommendations for Other Services       Precautions / Restrictions Precautions Precautions: Knee Precaution Booklet Issued: Yes (comment) Precaution Comments: Pt educated to not allow any pillow or bolster under knee for healing with optimal range of motion.       Mobility  Bed Mobility Overal bed mobility: Needs Assistance Bed Mobility: Supine to Sit     Supine to sit: Supervision     General bed mobility comments: Supervision for safety; cues to self-monitor for activity tolerance  Transfers Overall transfer level: Needs assistance Equipment used: Rolling walker (2 wheeled) Transfers: Sit to/from Stand Sit to Stand: Min guard         General transfer comment: one small loss of balance initially, with pt bracing backs of LEs against bed for stability; cues for hand placment   Ambulation/Gait Ambulation/Gait assistance: Min guard Ambulation Distance (Feet): 100 Feet Assistive device: Rolling walker (2 wheeled) Gait Pattern/deviations: Step-through pattern     General Gait  Details: Cues to activate L quad for stance stability  Stairs            Wheelchair Mobility    Modified Rankin (Stroke Patients Only)       Balance                                             Pertinent Vitals/Pain Pain Assessment: Faces Faces Pain Scale: Hurts little more Pain Location: L knee with flexion Pain Descriptors / Indicators: Aching;Sore Pain Intervention(s): Monitored during session;RN gave pain meds during session    Home Living Family/patient expects to be discharged to:: Private residence Living Arrangements: Spouse/significant other Available Help at Discharge: Family;Available 24 hours/day Type of Home: House Home Access: Stairs to enter Entrance Stairs-Rails: None Entrance Stairs-Number of Steps: 4 (4 front entrance no rails, 2 back entrance rail) Home Layout: One level Home Equipment: Walker - 2 wheels      Prior Function Level of Independence: Independent               Hand Dominance        Extremity/Trunk Assessment   Upper Extremity Assessment Upper Extremity Assessment: Overall WFL for tasks assessed    Lower Extremity Assessment Lower Extremity Assessment: LLE deficits/detail LLE Deficits / Details: Grossly decr AROM and strength postop; L knee approx 0-85deg       Communication   Communication: No difficulties  Cognition Arousal/Alertness: Awake/alert Behavior During Therapy: WFL for tasks assessed/performed Overall Cognitive Status: Within Functional Limits for tasks assessed  General Comments      Exercises     Assessment/Plan    PT Assessment Patient needs continued PT services  PT Problem List Decreased strength;Decreased range of motion;Decreased activity tolerance;Decreased balance;Decreased mobility;Decreased knowledge of use of DME;Decreased knowledge of precautions;Pain       PT Treatment Interventions DME instruction;Gait  training;Stair training;Functional mobility training;Therapeutic activities;Therapeutic exercise;Patient/family education    PT Goals (Current goals can be found in the Care Plan section)  Acute Rehab PT Goals Patient Stated Goal: back to doing anything he wants to do PT Goal Formulation: With patient Time For Goal Achievement: 05/01/16 Potential to Achieve Goals: Good    Frequency 7X/week   Barriers to discharge        Co-evaluation               End of Session Equipment Utilized During Treatment: Gait belt Activity Tolerance: Patient tolerated treatment well Patient left: in chair;with call bell/phone within reach;with family/visitor present Nurse Communication: Mobility status PT Visit Diagnosis: Unsteadiness on feet (R26.81);Pain Pain - Right/Left: Left Pain - part of body: Knee    Time: 1414-1435 PT Time Calculation (min) (ACUTE ONLY): 21 min   Charges:   PT Evaluation $PT Eval Low Complexity: 1 Procedure PT Treatments $Gait Training: 8-22 mins   PT G Codes:   PT G-Codes **NOT FOR INPATIENT CLASS** Functional Assessment Tool Used: Clinical judgement Functional Limitation: Mobility: Walking and moving around Mobility: Walking and Moving Around Current Status (N8295): At least 1 percent but less than 20 percent impaired, limited or restricted Mobility: Walking and Moving Around Goal Status [phone]): 0 percent impaired, limited or restricted    Van Clines, PT  Acute Rehabilitation Services Pager [phone] Office [phone]   [name] 04/17/2016, 3:16 PM

## 2016-04-17 NOTE — Anesthesia Procedure Notes (Signed)
Anesthesia Regional Block: Adductor canal block   Pre-Anesthetic Checklist: ,, timeout performed, Correct Patient, Correct Site, Correct Laterality, Correct Procedure, Correct Position, site marked, Risks and benefits discussed,  Surgical consent,  Pre-op evaluation,  At surgeon's request and post-op pain management  Laterality: Left  Prep: chloraprep       Needles:  Injection technique: Single-shot  Needle Type: Echogenic Needle     Needle Length: 9cm  Needle Gauge: 21     Additional Needles:   Procedures: ultrasound guided,,,,,,,,  Narrative:  Start time: 04/17/2016 7:03 AM End time: 04/17/2016 7:10 AM Injection made incrementally with aspirations every 5 mL.  Performed by: Personally  Anesthesiologist: Biruk Troia  Additional Notes: Pt tolerated the procedure well.

## 2016-04-17 NOTE — Transfer of Care (Signed)
Immediate Anesthesia Transfer of Care Note  Patient: Victor [name]  Procedure(s) Performed: Procedure(s): LEFT TOTAL KNEE ARTHROPLASTY (Left)  Patient Location: PACU  Anesthesia Type:Spinal  Level of Consciousness: awake and alert   Airway & Oxygen Therapy: Patient Spontanous Breathing and Patient connected to nasal cannula oxygen  Post-op Assessment: Report given to RN and Post -op Vital signs reviewed and stable  Post vital signs: Reviewed and stable  Last Vitals:  Vitals:   04/17/16 0645 04/17/16 0934  BP: (!) 162/77   Pulse: 74   Resp: 20   Temp: 36.8 C (P) 36.3 C    Last Pain: There were no vitals filed for this visit.       Complications: No apparent anesthesia complications

## 2016-04-18 ENCOUNTER — Encounter (HOSPITAL_COMMUNITY): Payer: Self-pay | Admitting: Orthopedic Surgery

## 2016-04-18 DIAGNOSIS — M1712 Unilateral primary osteoarthritis, left knee: Secondary | ICD-10-CM | POA: Diagnosis not present

## 2016-04-18 LAB — BASIC METABOLIC PANEL
ANION GAP: 9 (ref 5–15)
BUN: 14 mg/dL (ref 6–20)
CALCIUM: 9 mg/dL (ref 8.9–10.3)
CHLORIDE: 102 mmol/L (ref 101–111)
CO2: 27 mmol/L (ref 22–32)
Creatinine, Ser: 0.88 mg/dL (ref 0.61–1.24)
GFR calc Af Amer: >60 mL/min (ref >60)
GFR calc non Af Amer: >60 mL/min (ref >60)
Glucose, Bld: 169 mg/dL — ABNORMAL HIGH (ref 65–99)
Potassium: 5 mmol/L (ref 3.5–5.1)
Sodium: 138 mmol/L (ref 135–145)

## 2016-04-18 LAB — CBC
HEMATOCRIT: 44.1 % (ref 39.0–52.0)
HEMOGLOBIN: 14.9 g/dL (ref 13.0–17.0)
MCH: 31.4 pg (ref 26.0–34.0)
MCHC: 33.8 g/dL (ref 30.0–36.0)
MCV: 92.8 fL (ref 78.0–100.0)
Platelets: 228 10*3/uL (ref 150–400)
RBC: 4.75 MIL/uL (ref 4.22–5.81)
RDW: 13.5 % (ref 11.5–15.5)
WBC: 25 10*3/uL — AB (ref 4.0–10.5)

## 2016-04-18 MED ORDER — OXYCODONE HCL 10 MG PO TABS: 10.0000 mg | ORAL_TABLET | ORAL | 0 refills | Status: DC | PRN

## 2016-04-18 MED ORDER — METHOCARBAMOL 500 MG PO TABS: 500.0000 mg | ORAL_TABLET | Freq: Four times a day (QID) | ORAL | 0 refills | Status: DC | PRN

## 2016-04-18 MED ORDER — ASPIRIN 325 MG PO TBEC: 325.0000 mg | DELAYED_RELEASE_TABLET | Freq: Two times a day (BID) | ORAL | 0 refills | Status: DC

## 2016-04-18 NOTE — Op Note (Signed)
TOTAL KNEE REPLACEMENT OPERATIVE NOTE:  04/17/2016  1:32 PM  PATIENT:  Victor [name]  76 y.o. male  PRE-OPERATIVE DIAGNOSIS:  primary osteoarthritis left knee  POST-OPERATIVE DIAGNOSIS:  primary osteoarthritis left knee  PROCEDURE:  Procedure(s): LEFT TOTAL KNEE ARTHROPLASTY  SURGEON:  Surgeon(s): Dannielle Huh, MD  PHYSICIAN ASSISTANT: Laurier Nancy, [facility]   ANESTHESIA:   spinal  DRAINS: Hemovac  SPECIMEN: None  COUNTS:  Correct  TOURNIQUET:   Total Tourniquet Time Documented: Thigh (Left) - 50 minutes Total: Thigh (Left) - 50 minutes   DICTATION:  Indication for procedure:    The patient is a 76 y.o. male who has failed conservative treatment for primary osteoarthritis left knee.  Informed consent was obtained prior to anesthesia. The risks versus benefits of the operation were explain and in a way the patient can, and did, understand.   On the implant demand matching protocol, this patient scored 10.  Therefore, this patient was not receive a polyethylene insert with vitamin E which is a high demand implant.  Description of procedure:     The patient was taken to the operating room and placed under anesthesia.  The patient was positioned in the usual fashion taking care that all body parts were adequately padded and/or protected.  I foley catheter was not placed.  A tourniquet was applied and the leg prepped and draped in the usual sterile fashion.  The extremity was exsanguinated with the esmarch and tourniquet inflated to 350 mmHg.  Pre-operative range of motion was normal.  The knee was in 5 degree of mild varus.  A midline incision approximately 6-7 inches long was made with a #10 blade.  A new blade was used to make a parapatellar arthrotomy going 2-3 cm into the quadriceps tendon, over the patella, and alongside the medial aspect of the patellar tendon.  A synovectomy was then performed with the #10 blade and forceps. I then elevated the deep MCL off the medial  tibial metaphysis subperiosteally around to the semimembranosus attachment.    I everted the patella and used calipers to measure patellar thickness.  I used the reamer to ream down to appropriate thickness to recreate the native thickness.  I then removed excess bone with the rongeur and sagittal saw.  I used the appropriately sized template and drilled the three lug holes.  I then put the trial in place and measured the thickness with the calipers to ensure recreation of the native thickness.  The trial was then removed and the patella subluxed and the knee brought into flexion.  A homan retractor was place to retract and protect the patella and lateral structures.  A Z-retractor was place medially to protect the medial structures.  The extra-medullary alignment system was used to make cut the tibial articular surface perpendicular to the anamotic axis of the tibia and in 3 degrees of posterior slope.  The cut surface and alignment jig was removed.  I then used the intramedullary alignment guide to make a 3 valgus cut on the distal femur.  I then marked out the epicondylar axis on the distal femur.  The posterior condylar axis measured 3 degrees.  I then used the anterior referencing sizer and measured the femur to be a size 8.  The 4-In-1 cutting block was screwed into place in external rotation matching the posterior condylar angle, making our cuts perpendicular to the epicondylar axis.  Anterior, posterior and chamfer cuts were made with the sagittal saw.  The cutting block and cut  pieces were removed.  A lamina spreader was placed in 90 degrees of flexion.  The ACL, PCL, menisci, and posterior condylar osteophytes were removed.  A 13 mm spacer blocked was found to offer good flexion and extension gap balance after mild in degree releasing.   The scoop retractor was then placed and the femoral finishing block was pinned in place.  The small sagittal saw was used as well as the lug drill to finish the  femur.  The block and cut surfaces were removed and the medullary canal hole filled with autograft bone from the cut pieces.  The tibia was delivered forward in deep flexion and external rotation.  A size E tray was selected and pinned into place centered on the medial 1/3 of the tibial tubercle.  The reamer and keel was used to prepare the tibia through the tray.    I then trialed with the size 8 femur, size E tibia, a 13 mm insert and the 32 patella.  I had excellent flexion/extension gap balance, excellent patella tracking.  Flexion was full and beyond 120 degrees; extension was zero.  These components were chosen and the staff opened them to me on the back table while the knee was lavaged copiously and the cement mixed.  The soft tissue was infiltrated with 60cc of exparel 1.3% through a 21 gauge needle.  I cemented in the components and removed all excess cement.  The polyethylene tibial component was snapped into place and the knee placed in extension while cement was hardening.  The capsule was infilltrated with 30cc of .25% Marcaine with epinephrine.  A hemovac was place in the joint exiting superolaterally.  A pain pump was place superomedially superficial to the arthrotomy.  Once the cement was hard, the tourniquet was let down.  Hemostasis was obtained.  The arthrotomy was closed with figure-8 #1 vicryl sutures.  The deep soft tissues were closed with #0 vicryls and the subcuticular layer closed with a running #2-0 vicryl.  The skin was reapproximated and closed with skin staples.  The wound was dressed with xeroform, 4 x4's, 2 ABD sponges, a single layer of webril and a TED stocking.   The patient was then awakened, extubated, and taken to the recovery room in stable condition.  BLOOD LOSS:  300cc DRAINS: 1 hemovac, 1 pain catheter COMPLICATIONS:  None.  PLAN OF CARE: Admit to inpatient   PATIENT DISPOSITION:  PACU - hemodynamically stable.   Delay start of Pharmacological VTE agent  (>24hrs) due to surgical blood loss or risk of bleeding:  not applicable  Please fax a copy of this op note to my office at [phone] (please only include page 1 and 2 of the Case Information op note)

## 2016-04-18 NOTE — Discharge Summary (Signed)
SPORTS MEDICINE & JOINT REPLACEMENT   Georgena Spurling, MD   Laurier Nancy, PA-C [address] [place], [place], [place]  [address]                             [phone]  PATIENT ID: Victor [name]        MRN:  [id number]          DOB/AGE: 75-Jul-1942 / 76 y.o.    DISCHARGE SUMMARY  ADMISSION DATE:    04/17/2016 DISCHARGE DATE:   04/18/2016   ADMISSION DIAGNOSIS: primary osteoarthritis left knee    DISCHARGE DIAGNOSIS:  primary osteoarthritis left knee    ADDITIONAL DIAGNOSIS: Active Problems:   S/P total knee replacement  Past Medical History:  Diagnosis Date  . Arthritis   . Diverticulitis   . Geographic tongue   . Heart murmur    child  . History of kidney stones   . Hyperlipidemia   . Multiple allergies   . Restless leg syndrome   . Scoliosis    "adult"  . Stenosis of lumbosacral spine     PROCEDURE: Procedure(s): LEFT TOTAL KNEE ARTHROPLASTY on 04/17/2016  CONSULTS:    HISTORY:  See H&P in chart  HOSPITAL COURSE:  Victor [name] is a 76 y.o. admitted on 04/17/2016 and found to have a diagnosis of primary osteoarthritis left knee.  After appropriate laboratory studies were obtained  they were taken to the operating room on 04/17/2016 and underwent Procedure(s): LEFT TOTAL KNEE ARTHROPLASTY.   They were given perioperative antibiotics:  Anti-infectives    Start     Dose/Rate Route Frequency Ordered Stop   04/17/16 1445  ceFAZolin (ANCEF) IVPB 2g/100 mL premix     2 g 200 mL/hr over 30 Minutes Intravenous Every 6 hours 04/17/16 1241 04/17/16 2107   04/17/16 0700  ceFAZolin (ANCEF) 3 g in dextrose 5 % 50 mL IVPB     3 g 130 mL/hr over 30 Minutes Intravenous To ShortStay Surgical 04/14/16 1035 04/17/16 0738    .  Patient given tranexamic acid IV or topical and exparel intra-operatively.  Tolerated the procedure well.    POD# 1: Vital signs were stable.  Patient denied Chest pain, shortness of breath, or calf pain.  Patient was started on Lovenox 30 mg  subcutaneously twice daily at 8am.  Consults to PT, OT, and care management were made.  The patient was weight bearing as tolerated.  CPM was placed on the operative leg 0-90 degrees for 6-8 hours a day. When out of the CPM, patient was placed in the foam block to achieve full extension. Incentive spirometry was taught.  Dressing was changed.       POD #2, Continued  PT for ambulation and exercise program.  IV saline locked.  O2 discontinued.    The remainder of the hospital course was dedicated to ambulation and strengthening.   The patient was discharged on 1 Day Post-Op in  Good condition.  Blood products given:none  DIAGNOSTIC STUDIES: Recent vital signs: Patient Vitals for the past 24 hrs:  BP Temp Temp src Pulse Resp SpO2  04/18/16 0630 135/72 97.5 F (36.4 C) Oral 90 17 100 %  04/18/16 0002 140/67 97.6 F (36.4 C) Oral 97 17 100 %  04/17/16 2057 (!) 159/85 97.6 F (36.4 C) Oral 92 17 100 %  04/17/16 1258 136/82 97.6 F (36.4 C) Oral 83 17 95 %  04/17/16 1211 - 98.3 F (  36.8 C) - 76 17 98 %  04/17/16 1205 (!) 153/90 - - 79 12 99 %  04/17/16 1200 - - - 78 17 99 %  04/17/16 1150 (!) 149/81 - - 79 14 99 %  04/17/16 1135 (!) 141/82 - - 74 16 100 %  04/17/16 1130 - - - 71 15 100 %  04/17/16 1100 - - - 66 16 100 %  04/17/16 1035 (!) 142/83 - - 65 10 100 %  04/17/16 1030 - - - 72 18 100 %  04/17/16 1020 139/72 - - 66 17 100 %  04/17/16 1005 (!) 146/69 - - 71 12 100 %  04/17/16 1000 - - - 69 11 100 %  04/17/16 0934 139/77 97.3 F (36.3 C) - 80 10 100 %       Recent laboratory studies: No results for input(s): WBC, HGB, HCT, PLT in the last 168 hours. No results for input(s): NA, K, CL, CO2, BUN, CREATININE, GLUCOSE, CALCIUM in the last 168 hours. No results found for: INR, PROTIME   Recent Radiographic Studies :  No results found.  DISCHARGE INSTRUCTIONS: Discharge Instructions    CPM    Complete by:  As directed    Continuous passive motion machine (CPM):      Use  the CPM from 0 to 90 for 4-6 hours per day.      You may increase by 10 per day.  You may break it up into 2 or 3 sessions per day.      Use CPM for 2 weeks or until you are told to stop.   Call MD / Call 911    Complete by:  As directed    If you experience chest pain or shortness of breath, CALL 911 and be transported to the hospital emergency room.  If you develope a fever above 101 F, pus (white drainage) or increased drainage or redness at the wound, or calf pain, call your surgeon's office.   Constipation Prevention    Complete by:  As directed    Drink plenty of fluids.  Prune juice may be helpful.  You may use a stool softener, such as Colace (over the counter) 100 mg twice a day.  Use MiraLax (over the counter) for constipation as needed.   Diet - low sodium heart healthy    Complete by:  As directed    Discharge instructions    Complete by:  As directed    INSTRUCTIONS AFTER JOINT REPLACEMENT   Remove items at home which could result in a fall. This includes throw rugs or furniture in walking pathways ICE to the affected joint every three hours while awake for 30 minutes at a time, for at least the first 3-5 days, and then as needed for pain and swelling.  Continue to use ice for pain and swelling. You may notice swelling that will progress down to the foot and ankle.  This is normal after surgery.  Elevate your leg when you are not up walking on it.   Continue to use the breathing machine you got in the hospital (incentive spirometer) which will help keep your temperature down.  It is common for your temperature to cycle up and down following surgery, especially at night when you are not up moving around and exerting yourself.  The breathing machine keeps your lungs expanded and your temperature down.   DIET:  As you were doing prior to hospitalization, we recommend a well-balanced diet.  DRESSING /  WOUND CARE / SHOWERING  Keep the surgical dressing until follow up.  The dressing  is water proof, so you can shower without any extra covering.  IF THE DRESSING FALLS OFF or the wound gets wet inside, change the dressing with sterile gauze.  Please use good hand washing techniques before changing the dressing.  Do not use any lotions or creams on the incision until instructed by your surgeon.    ACTIVITY  Increase activity slowly as tolerated, but follow the weight bearing instructions below.   No driving for 6 weeks or until further direction given by your physician.  You cannot drive while taking narcotics.  No lifting or carrying greater than 10 lbs. until further directed by your surgeon. Avoid periods of inactivity such as sitting longer than an hour when not asleep. This helps prevent blood clots.  You may return to work once you are authorized by your doctor.     WEIGHT BEARING   Weight bearing as tolerated with assist device (walker, cane, etc) as directed, use it as long as suggested by your surgeon or therapist, typically at least 4-6 weeks.   EXERCISES  Results after joint replacement surgery are often greatly improved when you follow the exercise, range of motion and muscle strengthening exercises prescribed by your doctor. Safety measures are also important to protect the joint from further injury. Any time any of these exercises cause you to have increased pain or swelling, decrease what you are doing until you are comfortable again and then slowly increase them. If you have problems or questions, call your caregiver or physical therapist for advice.   Rehabilitation is important following a joint replacement. After just a few days of immobilization, the muscles of the leg can become weakened and shrink (atrophy).  These exercises are designed to build up the tone and strength of the thigh and leg muscles and to improve motion. Often times heat used for twenty to thirty minutes before working out will loosen up your tissues and help with improving the range of  motion but do not use heat for the first two weeks following surgery (sometimes heat can increase post-operative swelling).   These exercises can be done on a training (exercise) mat, on the floor, on a table or on a bed. Use whatever works the best and is most comfortable for you.    Use music or television while you are exercising so that the exercises are a pleasant break in your day. This will make your life better with the exercises acting as a break in your routine that you can look forward to.   Perform all exercises about fifteen times, three times per day or as directed.  You should exercise both the operative leg and the other leg as well.   Exercises include:   Quad Sets - Tighten up the muscle on the front of the thigh (Quad) and hold for 5-10 seconds.   Straight Leg Raises - With your knee straight (if you were given a brace, keep it on), lift the leg to 60 degrees, hold for 3 seconds, and slowly lower the leg.  Perform this exercise against resistance later as your leg gets stronger.  Leg Slides: Lying on your back, slowly slide your foot toward your buttocks, bending your knee up off the floor (only go as far as is comfortable). Then slowly slide your foot back down until your leg is flat on the floor again.  Angel Wings: Lying on your back spread  your legs to the side as far apart as you can without causing discomfort.  Hamstring Strength:  Lying on your back, push your heel against the floor with your leg straight by tightening up the muscles of your buttocks.  Repeat, but this time bend your knee to a comfortable angle, and push your heel against the floor.  You may put a pillow under the heel to make it more comfortable if necessary.   A rehabilitation program following joint replacement surgery can speed recovery and prevent re-injury in the future due to weakened muscles. Contact your doctor or a physical therapist for more information on knee rehabilitation.     CONSTIPATION  Constipation is defined medically as fewer than three stools per week and severe constipation as less than one stool per week.  Even if you have a regular bowel pattern at home, your normal regimen is likely to be disrupted due to multiple reasons following surgery.  Combination of anesthesia, postoperative narcotics, change in appetite and fluid intake all can affect your bowels.   YOU MUST use at least one of the following options; they are listed in order of increasing strength to get the job done.  They are all available over the counter, and you may need to use some, POSSIBLY even all of these options:    Drink plenty of fluids (prune juice may be helpful) and high fiber foods Colace 100 mg by mouth twice a day  Senokot for constipation as directed and as needed Dulcolax (bisacodyl), take with full glass of water  Miralax (polyethylene glycol) once or twice a day as needed.  If you have tried all these things and are unable to have a bowel movement in the first 3-4 days after surgery call either your surgeon or your primary doctor.    If you experience loose stools or diarrhea, hold the medications until you stool forms back up.  If your symptoms do not get better within 1 week or if they get worse, check with your doctor.  If you experience "the worst abdominal pain ever" or develop nausea or vomiting, please contact the office immediately for further recommendations for treatment.   ITCHING:  If you experience itching with your medications, try taking only a single pain pill, or even half a pain pill at a time.  You can also use Benadryl over the counter for itching or also to help with sleep.   TED HOSE STOCKINGS:  Use stockings on both legs until for at least 2 weeks or as directed by physician office. They may be removed at night for sleeping.  MEDICATIONS:  See your medication summary on the "After Visit Summary" that nursing will review with you.  You may have some  home medications which will be placed on hold until you complete the course of blood thinner medication.  It is important for you to complete the blood thinner medication as prescribed.  PRECAUTIONS:  If you experience chest pain or shortness of breath - call 911 immediately for transfer to the hospital emergency department.   If you develop a fever greater that 101 F, purulent drainage from wound, increased redness or drainage from wound, foul odor from the wound/dressing, or calf pain - CONTACT YOUR SURGEON.  FOLLOW-UP APPOINTMENTS:  If you do not already have a post-op appointment, please call the office for an appointment to be seen by your surgeon.  Guidelines for how soon to be seen are listed in your "After Visit Summary", but are typically between 1-4 weeks after surgery.  OTHER INSTRUCTIONS:   Knee Replacement:  Do not place pillow under knee, focus on keeping the knee straight while resting. CPM instructions: 0-90 degrees, 2 hours in the morning, 2 hours in the afternoon, and 2 hours in the evening. Place foam block, curve side up under heel at all times except when in CPM or when walking.  DO NOT modify, tear, cut, or change the foam block in any way.  MAKE SURE YOU:  Understand these instructions.  Get help right away if you are not doing well or get worse.    Thank you for letting us be a part of your medical care team.  It is a privilege we respect greatly.  We hope these instructions will help you stay on track for a fast and full recovery!   Increase activity slowly as tolerated    Complete by:  As directed       DISCHARGE MEDICATIONS:   Allergies as of 04/18/2016      Reactions   Hydrocodone-acetaminophen Itching      Medication List    TAKE these medications   acetaminophen 500 MG tablet Commonly known as:  TYLENOL Take 1,000 mg by mouth every 6 (six) hours as needed for mild pain.   aspirin 325 MG EC tablet Take 1  tablet (325 mg total) by mouth 2 (two) times daily.   celecoxib 200 MG capsule Commonly known as:  CELEBREX Take 200 mg by mouth at bedtime.   COMBIGAN 0.2-0.5 % ophthalmic solution Generic drug:  brimonidine-timolol Place 1 drop into both eyes every 12 (twelve) hours.   latanoprost 0.005 % ophthalmic solution Commonly known as:  XALATAN Place 1 drop into both eyes 1 day or 1 dose.   methocarbamol 500 MG tablet Commonly known as:  ROBAXIN Take 1-2 tablets (500-1,000 mg total) by mouth every 6 (six) hours as needed for muscle spasms.   Oxycodone HCl 10 MG Tabs Take 1 tablet (10 mg total) by mouth every 3 (three) hours as needed for breakthrough pain.   simvastatin 40 MG tablet Commonly known as:  ZOCOR Take 40 mg by mouth at bedtime.            Durable Medical Equipment        Start     Ordered   04/17/16 1241  DME Walker rolling  Once    Question:  Patient needs a walker to treat with the following condition  Answer:  S/P total knee replacement   04/17/16 1241   04/17/16 1241  DME 3 n 1  Once     04/17/16 1241   04/17/16 1241  DME Bedside commode  Once    Question:  Patient needs a bedside commode to treat with the following condition  Answer:  S/P total knee replacement   04/17/16 1241      FOLLOW UP VISIT:    DISPOSITION: HOME VS. SNF  CONDITION:  Good   Guy Sandifer 04/18/2016, 7:11 AM

## 2016-04-18 NOTE — Evaluation (Signed)
Occupational Therapy Evaluation Patient Details Name: Victor [name] MRN: [id number] DOB: 1940/01/10 Today's Date: 04/18/2016    History of Present Illness Admitted for LTKA, Victor [name];  has a pertinent  past medical history of Arthritis; Scoliosis; and Stenosis of lumbosacral spine.  has a pertinent past surgical history that includes  Knee arthroscopy (Left); Rotator cuff repair; Shoulder surgery (Left)   Clinical Impression   PTA, pt lived with his wife and was independent. Currently, pt requires Min A for LB ADLs, Min guard for functional mobility, and Min A for tub transfer. Provided education on LB ADL strategies, tub transfer, RW management, and fall prevention. Pt demonstrated good understanding of education, and provided pt with handout on tub transfer steps. Answered all pt questions. Recommend pt dc home once medically stable. All acute OT needs met and will sign off.     Follow Up Recommendations  No OT follow up;Supervision/Assistance - 24 hour    Equipment Recommendations  None recommended by OT    Recommendations for Other Services PT consult     Precautions / Restrictions Precautions Precautions: Knee Precaution Booklet Issued: Yes (comment) Precaution Comments: Reviewed not placing anything under the knee in rest Restrictions Weight Bearing Restrictions: Yes LLE Weight Bearing: Weight bearing as tolerated      Mobility Bed Mobility Overal bed mobility: Modified Independent Bed Mobility: Supine to Sit     Supine to sit: Supervision     General bed mobility comments: Supervision for safety. Lowered HOB and rail to simulate bed mobility at home. Exited on L side of bed  Transfers Overall transfer level: Needs assistance Equipment used: Rolling walker (2 wheeled) Transfers: Sit to/from Stand Sit to Stand: Min guard         General transfer comment: Cues for hand placement    Balance Overall balance assessment: Needs assistance Sitting-balance  support: Feet supported;No upper extremity supported Sitting balance-Leahy Scale: Good Sitting balance - Comments: donned LB clothes   Standing balance support: No upper extremity supported;During functional activity Standing balance-Leahy Scale: Good Standing balance comment: Able to perform tub transfer with Min A for safety                           ADL either performed or assessed with clinical judgement   ADL Overall ADL's : Needs assistance/impaired Eating/Feeding: Set up;Sitting   Grooming: Min guard;Standing   Upper Body Bathing: Set up;Supervision/ safety;Sitting   Lower Body Bathing: Minimal assistance;Sit to/from stand   Upper Body Dressing : Set up;Supervision/safety;Sitting   Lower Body Dressing: Minimal assistance;Sit to/from stand Lower Body Dressing Details (indicate cue type and reason): Donned pants with Min A to get over distal part of feet. Pt reports that wife will be present to assist if needed Toilet Transfer: Min guard;BSC;RW;Ambulation       Tub/ Engineer, structural: Tub transfer;Minimal assistance;Ambulation;Shower Dealer Details (indicate cue type and reason): Pt demonstrated understanding of tub transfer tehnique requiring Min A for balance  (Provided handout) Functional mobility during ADLs: Min guard;Rolling walker General ADL Comments: Pt demosntrates good progress and feel he will progress well with time. Provided education on LB ADLs, tub trasnfer, and RW management.      Vision         Perception     Praxis      Pertinent Vitals/Pain Pain Assessment: Faces Faces Pain Scale: Hurts little more Pain Location: L knee with flexion Pain Descriptors / Indicators: Aching;Sore Pain Intervention(s):  Monitored during session     Hand Dominance Right   Extremity/Trunk Assessment Upper Extremity Assessment Upper Extremity Assessment: Overall WFL for tasks assessed   Lower Extremity Assessment Lower  Extremity Assessment: Defer to PT evaluation;LLE deficits/detail LLE Deficits / Details: Grossly decr AROM and strength postop   Cervical / Trunk Assessment Cervical / Trunk Assessment: Normal   Communication Communication Communication: No difficulties   Cognition Arousal/Alertness: Awake/alert Behavior During Therapy: WFL for tasks assessed/performed Overall Cognitive Status: Within Functional Limits for tasks assessed                                     General Comments       Exercises     Shoulder Instructions      Home Living Family/patient expects to be discharged to:: Private residence Living Arrangements: Spouse/significant other Available Help at Discharge: Family;Available 24 hours/day Type of Home: House Home Access: Stairs to enter [employer] of Steps: 4 (4 front entrance no rails, 2 back entrance rail) Entrance Stairs-Rails: None Home Layout: One level     Bathroom Shower/Tub: Teacher, early years/pre: Standard (3N1 over the toilet) Bathroom Accessibility: Yes How Accessible: Accessible via walker Home Equipment: [place]ney - 2 wheels;Bedside commode;Shower seat;Hand held shower head;Grab bars - tub/shower          Prior Functioning/Environment Level of Independence: Independent                 OT Problem List: Decreased strength;Decreased activity tolerance;Pain;Decreased knowledge of use of DME or AE;Impaired balance (sitting and/or standing)      OT Treatment/Interventions:      OT Goals(Current goals can be found in the care plan section) Acute Rehab OT Goals Patient Stated Goal: back to doing anything he wants to do OT Goal Formulation: With patient Time For Goal Achievement: 05/02/16 Potential to Achieve Goals: Good  OT Frequency:     Barriers to D/C:            Co-evaluation              End of Session Equipment Utilized During Treatment: Gait belt;Rolling walker CPM Left Knee CPM Left  Knee: Off Left Knee Flexion (Degrees): 65 Left Knee Extension (Degrees): 0 Additional Comments: foot roll Nurse Communication: Mobility status  Activity Tolerance: Patient tolerated treatment well Patient left: with call bell/phone within reach;in chair  OT Visit Diagnosis: Unsteadiness on feet (R26.81);Muscle weakness (generalized) (M62.81);Pain Pain - Right/Left: Left Pain - part of body: Knee                Time: 2248-2500 OT Time Calculation (min): 24 min Charges:  OT General Charges $OT Visit: 1 Procedure OT Evaluation $OT Eval Low Complexity: 1 Procedure OT Treatments $Self Care/Home Management : 8-22 mins G-Codes: OT G-codes **NOT FOR INPATIENT CLASS** Functional Assessment Tool Used: Clinical judgement Functional Limitation: Self care Self Care Current Status (B7048): At least 1 percent but less than 20 percent impaired, limited or restricted Self Care Goal Status (G8916): At least 1 percent but less than 20 percent impaired, limited or restricted Self Care Discharge Status [phone]): At least 1 percent but less than 20 percent impaired, limited or restricted   [facility], OTR/L [place] 04/18/2016, 8:37 AM

## 2016-04-18 NOTE — Progress Notes (Signed)
Physical Therapy Treatment Patient Details Name: Victor [name] MRN: [id number] DOB: July 08, 1940 Today's Date: 04/18/2016    History of Present Illness Admitted for LTKA, [name];  has a pertinent  past medical history of Arthritis; Scoliosis; and Stenosis of lumbosacral spine.  has a pertinent past surgical history that includes  Knee arthroscopy (Left); Rotator cuff repair; Shoulder surgery (Left)    PT Comments    Patient is making good progress with PT.  From a mobility standpoint anticipate patient will be ready for DC home when medically ready.    Follow Up Recommendations  Home health PT;Supervision/Assistance - 24 hour     Equipment Recommendations  Rolling walker with 5" wheels;3in1 (PT) (May already have)    Recommendations for Other Services       Precautions / Restrictions Precautions Precautions: Knee Precaution Booklet Issued: Yes (comment) Precaution Comments: reviewed precautions/positioning Restrictions Weight Bearing Restrictions: Yes LLE Weight Bearing: Weight bearing as tolerated    Mobility  Bed Mobility Overal bed mobility: Modified Independent Bed Mobility: Supine to Sit     Supine to sit: Supervision     General bed mobility comments: Supervision for safety. Lowered HOB and rail to simulate bed mobility at home. Exited on L side of bed  Transfers Overall transfer level: Needs assistance Equipment used: Rolling walker (2 wheeled) Transfers: Sit to/from Stand Sit to Stand: Min guard         General transfer comment: pt with demo of safe hand placement and technique  Ambulation/Gait Ambulation/Gait assistance: Min guard Ambulation Distance (Feet): 150 Feet Assistive device: Rolling walker (2 wheeled) Gait Pattern/deviations: Step-through pattern     General Gait Details: cues for posture and proximity of RW initially   Stairs Stairs: Yes   Stair Management: No rails;Step to pattern;Backwards;With walker Number of Stairs:  4 General stair comments: cues for hand placement and technique; assist to stabilize RW; family present  Wheelchair Mobility    Modified Rankin (Stroke Patients Only)       Balance Overall balance assessment: Needs assistance Sitting-balance support: Feet supported;No upper extremity supported Sitting balance-Leahy Scale: Good Sitting balance - Comments: donned LB clothes   Standing balance support: No upper extremity supported;During functional activity Standing balance-Leahy Scale: Good Standing balance comment: Able to perform tub transfer with Min A for safety                            Cognition Arousal/Alertness: Awake/alert Behavior During Therapy: WFL for tasks assessed/performed Overall Cognitive Status: Within Functional Limits for tasks assessed                                        Exercises Total Joint Exercises Quad Sets: AROM;Left;10 reps Short Arc Quad: AROM;Left;10 reps Heel Slides: AAROM;Left;10 reps Hip ABduction/ADduction: AROM;Left;10 reps Straight Leg Raises: AROM;Left;10 reps Long Arc Quad: AROM;Left;10 reps Knee Flexion: AROM;Left;5 reps;Seated (10 sec holds) Goniometric ROM: 5-90    General Comments        Pertinent Vitals/Pain Pain Assessment: 0-10 Pain Score: 5  Faces Pain Scale: Hurts little more Pain Location: L knee Pain Descriptors / Indicators: Aching;Sore Pain Intervention(s): Limited activity within patient's tolerance;Monitored during session;Premedicated before session;Repositioned    Home Living Family/patient expects to be discharged to:: Private residence Living Arrangements: Spouse/significant other Available Help at Discharge: Family;Available 24 hours/day Type of Home: House Home Access: Stairs to  enter Entrance Stairs-Rails: None Home Layout: One level Home Equipment: Walker - 2 wheels;Bedside commode;Shower seat;Hand held shower head;Grab bars - tub/shower      Prior Function Level of  Independence: Independent          PT Goals (current goals can now be found in the care plan section) Acute Rehab PT Goals Patient Stated Goal: back to doing anything he wants to do PT Goal Formulation: With patient Time For Goal Achievement: 05/01/16 Potential to Achieve Goals: Good Progress towards PT goals: Progressing toward goals    Frequency    7X/week      PT Plan Current plan remains appropriate    Co-evaluation             End of Session Equipment Utilized During Treatment: Gait belt Activity Tolerance: Patient tolerated treatment well Patient left: in chair;with call bell/phone within reach;with family/visitor present Nurse Communication: Mobility status PT Visit Diagnosis: Unsteadiness on feet (R26.81);Pain Pain - Right/Left: Left Pain - part of body: Knee     Time: 4098-1191 PT Time Calculation (min) (ACUTE ONLY): 38 min  Charges:  $Gait Training: 8-22 mins $Therapeutic Exercise: 23-37 mins                    G Codes:       Erline Levine, PTA Pager: [phone]     Carolynne Edouard 04/18/2016, 10:22 AM

## 2016-04-18 NOTE — Care Management Note (Signed)
Case Management Note  Patient Details  Name: Victor [name] MRN: [id number] Date of Birth: 02/20/1940  Subjective/Objective:   76 yr old male s/p left total knee arthroplasty.                 Action/Plan: Case manager spoke with patient and his wife concerning discharge plan and DME needs. Patient was preoperatively setup with [facility], no changes. He will have have family support at discharge.    Expected Discharge Date:  04/18/16               Expected Discharge Plan:  Home w Home Health Services  In-House Referral:  NA  Discharge planning Services  CM Consult  Post Acute Care Choice:  Home Health, Durable Medical Equipment Choice offered to:  Patient, Spouse  DME Arranged:  3-N-1, CPM, Walker rolling DME Agency:  [employer]  HH Arranged:  PT HH Agency:  [facility]  Status of Service:  Completed, signed off  If discussed at [employer] [facility] [employer], dates discussed:    Additional Comments:  Durenda Guthrie, RN 04/18/2016, 12:35 PM

## 2016-04-18 NOTE — Progress Notes (Signed)
SPORTS MEDICINE AND JOINT REPLACEMENT  Georgena Spurling, MD    Laurier Nancy, PA-C [address] [place], [place], [place]  [address]                             [phone]   PROGRESS NOTE  Subjective:  negative for Chest Pain  negative for Shortness of Breath  negative for Nausea/Vomiting   negative for Calf Pain  negative for Bowel Movement   Tolerating Diet: yes         Patient reports pain as 3 on 0-10 scale.    Objective: Vital signs in last 24 hours:   Patient Vitals for the past 24 hrs:  BP Temp Temp src Pulse Resp SpO2  04/18/16 0630 135/72 97.5 F (36.4 C) Oral 90 17 100 %  04/18/16 0002 140/67 97.6 F (36.4 C) Oral 97 17 100 %  04/17/16 2057 (!) 159/85 97.6 F (36.4 C) Oral 92 17 100 %  04/17/16 1258 136/82 97.6 F (36.4 C) Oral 83 17 95 %  04/17/16 1211 - 98.3 F (36.8 C) - 76 17 98 %  04/17/16 1205 (!) 153/90 - - 79 12 99 %  04/17/16 1200 - - - 78 17 99 %  04/17/16 1150 (!) 149/81 - - 79 14 99 %  04/17/16 1135 (!) 141/82 - - 74 16 100 %  04/17/16 1130 - - - 71 15 100 %  04/17/16 1100 - - - 66 16 100 %  04/17/16 1035 (!) 142/83 - - 65 10 100 %  04/17/16 1030 - - - 72 18 100 %  04/17/16 1020 139/72 - - 66 17 100 %  04/17/16 1005 (!) 146/69 - - 71 12 100 %  04/17/16 1000 - - - 69 11 100 %  04/17/16 0934 139/77 97.3 F (36.3 C) - 80 10 100 %    {1959:LAST@   Intake/Output from previous day:   04/16 0701 - 04/17 0700 In: 2645 [P.O.:240; I.V.:2405] Out: 1325 [Urine:1225]   Intake/Output this shift:   No intake/output data recorded.   Intake/Output      04/16 0701 - 04/17 0700 04/17 0701 - 04/18 0700   P.O. 240    I.V. 2405    IV Piggyback 0    Total Intake 2645     Urine 1225    Blood 100    Total Output 1325     Net +1320             LABORATORY DATA: No results for input(s): WBC, HGB, HCT, PLT in the last 168 hours. No results for input(s): NA, K, CL, CO2, BUN, CREATININE, GLUCOSE, CALCIUM in the last 168 hours. No results found for:  INR, PROTIME  Examination:  General appearance: alert, cooperative and no distress Extremities: extremities normal, atraumatic, no cyanosis or edema  Wound Exam: clean, dry, intact   Drainage:  None: wound tissue dry  Motor Exam: Quadriceps and Hamstrings Intact  Sensory Exam: Superficial Peroneal, Deep Peroneal and Tibial normal   Assessment:    1 Day Post-Op  Procedure(s) (LRB): LEFT TOTAL KNEE ARTHROPLASTY (Left)  ADDITIONAL DIAGNOSIS:  Active Problems:   S/P total knee replacement     Plan: Physical Therapy as ordered Weight Bearing as Tolerated (WBAT)  DVT Prophylaxis:  Aspirin  DISCHARGE PLAN: Home  DISCHARGE NEEDS: HHPT   Patient doing great. Anticipate D/C home today         Guy Sandifer  04/18/2016, 7:08 AM

## 2016-04-18 NOTE — Progress Notes (Signed)
Pt discharge education and instructions completed with pt and family at bedside; all voices understanding and denies any questions. Pt IV removed; left knee incision ace wrap dsg remains clean, dry and intact. Pt handed his prescriptions for Robaxin, oxycodone and aspirin. Pt discharge home with family to transport him home. Pt transported off unit via wheelchair with belongings and family to the side. Dionne Bucy RN

## 2017-08-27 ENCOUNTER — Other Ambulatory Visit: Payer: Self-pay | Admitting: Neurological Surgery

## 2017-08-27 DIAGNOSIS — M4726 Other spondylosis with radiculopathy, lumbar region: Secondary | ICD-10-CM

## 2017-08-30 ENCOUNTER — Ambulatory Visit
Admission: RE | Admit: 2017-08-30 | Discharge: 2017-08-30 | Disposition: A | Discharge status: Home / Self Care | Payer: Federal, State, Local not specified - PPO | Source: Ambulatory Visit | Attending: Neurological Surgery | Admitting: Neurological Surgery

## 2017-08-30 DIAGNOSIS — M4726 Other spondylosis with radiculopathy, lumbar region: Secondary | ICD-10-CM

## 2020-01-12 ENCOUNTER — Other Ambulatory Visit: Payer: Self-pay

## 2020-01-12 ENCOUNTER — Other Ambulatory Visit: Payer: Federal, State, Local not specified - PPO

## 2020-01-12 DIAGNOSIS — Z20822 Contact with and (suspected) exposure to covid-19: Secondary | ICD-10-CM

## 2020-01-15 LAB — NOVEL CORONAVIRUS, NAA: SARS-CoV-2, NAA: NOT DETECTED

## 2020-11-30 ENCOUNTER — Other Ambulatory Visit (HOSPITAL_COMMUNITY): Payer: Self-pay

## 2020-11-30 MED ORDER — OZEMPIC (2 MG/DOSE) 8 MG/3ML ~~LOC~~ SOPN
2.0000 mg | PEN_INJECTOR | SUBCUTANEOUS | 5 refills | Status: DC
  Filled 2020-11-30: qty 3, 28d supply, fill #0
  Filled 2020-12-28: qty 3, 28d supply, fill #1
  Filled 2021-01-25: qty 3, 28d supply, fill #2
  Filled 2021-02-22: qty 3, 28d supply, fill #3
  Filled 2021-03-22: qty 3, 28d supply, fill #4
  Filled 2021-04-19: qty 3, 28d supply, fill #5

## 2020-12-28 ENCOUNTER — Other Ambulatory Visit (HOSPITAL_COMMUNITY): Payer: Self-pay

## 2021-01-25 ENCOUNTER — Other Ambulatory Visit (HOSPITAL_COMMUNITY): Payer: Self-pay

## 2021-02-22 ENCOUNTER — Other Ambulatory Visit (HOSPITAL_COMMUNITY): Payer: Self-pay

## 2021-03-22 ENCOUNTER — Other Ambulatory Visit (HOSPITAL_COMMUNITY): Payer: Self-pay

## 2021-04-19 ENCOUNTER — Other Ambulatory Visit (HOSPITAL_COMMUNITY): Payer: Self-pay

## 2021-05-17 ENCOUNTER — Other Ambulatory Visit (HOSPITAL_COMMUNITY): Payer: Self-pay

## 2021-05-17 MED ORDER — OZEMPIC (2 MG/DOSE) 8 MG/3ML ~~LOC~~ SOPN
2.0000 mg | PEN_INJECTOR | SUBCUTANEOUS | 5 refills | Status: AC
  Filled 2021-05-17: qty 3, 28d supply, fill #0
  Filled 2021-10-11: qty 3, 28d supply, fill #1

## 2021-05-17 MED ORDER — OZEMPIC (2 MG/DOSE) 8 MG/3ML ~~LOC~~ SOPN
2.0000 mg | PEN_INJECTOR | SUBCUTANEOUS | 5 refills | Status: AC
  Filled 2021-05-17 – 2021-06-14 (×2): qty 3, 28d supply, fill #0
  Filled 2021-07-08: qty 3, 28d supply, fill #1
  Filled 2021-08-16: qty 3, 28d supply, fill #2
  Filled 2021-09-14: qty 3, 28d supply, fill #3
  Filled 2021-11-08: qty 3, 28d supply, fill #4
  Filled 2021-12-06: qty 3, 28d supply, fill #5

## 2021-06-14 ENCOUNTER — Other Ambulatory Visit (HOSPITAL_COMMUNITY): Payer: Self-pay

## 2021-07-08 ENCOUNTER — Other Ambulatory Visit (HOSPITAL_COMMUNITY): Payer: Self-pay

## 2021-08-16 ENCOUNTER — Other Ambulatory Visit (HOSPITAL_COMMUNITY): Payer: Self-pay

## 2021-09-14 ENCOUNTER — Other Ambulatory Visit (HOSPITAL_COMMUNITY): Payer: Self-pay

## 2021-10-11 ENCOUNTER — Other Ambulatory Visit (HOSPITAL_COMMUNITY): Payer: Self-pay

## 2021-11-08 ENCOUNTER — Other Ambulatory Visit (HOSPITAL_COMMUNITY): Payer: Self-pay

## 2021-12-06 ENCOUNTER — Other Ambulatory Visit (HOSPITAL_COMMUNITY): Payer: Self-pay

## 2022-01-03 ENCOUNTER — Other Ambulatory Visit (HOSPITAL_COMMUNITY): Payer: Self-pay

## 2022-01-03 MED ORDER — OZEMPIC (2 MG/DOSE) 8 MG/3ML ~~LOC~~ SOPN
2.0000 mg | PEN_INJECTOR | SUBCUTANEOUS | 5 refills | Status: DC
  Filled 2022-01-03: qty 3, 28d supply, fill #0
  Filled 2022-01-31: qty 3, 28d supply, fill #1
  Filled 2022-02-28: qty 3, 28d supply, fill #2
  Filled 2022-03-28: qty 3, 28d supply, fill #3
  Filled 2022-04-25: qty 3, 28d supply, fill #4
  Filled 2022-05-23: qty 3, 28d supply, fill #5

## 2022-01-31 ENCOUNTER — Other Ambulatory Visit (HOSPITAL_COMMUNITY): Payer: Self-pay

## 2022-02-28 ENCOUNTER — Other Ambulatory Visit (HOSPITAL_COMMUNITY): Payer: Self-pay

## 2022-03-28 ENCOUNTER — Other Ambulatory Visit (HOSPITAL_COMMUNITY): Payer: Self-pay

## 2022-04-25 ENCOUNTER — Other Ambulatory Visit (HOSPITAL_COMMUNITY): Payer: Self-pay

## 2022-05-23 ENCOUNTER — Other Ambulatory Visit (HOSPITAL_COMMUNITY): Payer: Self-pay

## 2022-06-20 ENCOUNTER — Other Ambulatory Visit (HOSPITAL_COMMUNITY): Payer: Self-pay

## 2022-06-21 ENCOUNTER — Other Ambulatory Visit (HOSPITAL_COMMUNITY): Payer: Self-pay

## 2022-06-21 MED ORDER — OZEMPIC (2 MG/DOSE) 8 MG/3ML ~~LOC~~ SOPN
2.0000 mg | PEN_INJECTOR | SUBCUTANEOUS | 5 refills | Status: DC
  Filled 2022-06-21: qty 3, 28d supply, fill #0
  Filled 2022-07-18: qty 3, 28d supply, fill #1
  Filled 2022-08-18: qty 3, 28d supply, fill #2
  Filled 2022-09-21: qty 3, 28d supply, fill #3
  Filled 2022-10-24: qty 3, 28d supply, fill #4
  Filled 2022-11-21: qty 3, 28d supply, fill #5

## 2022-07-18 ENCOUNTER — Other Ambulatory Visit (HOSPITAL_COMMUNITY): Payer: Self-pay

## 2022-08-18 ENCOUNTER — Other Ambulatory Visit (HOSPITAL_COMMUNITY): Payer: Self-pay

## 2022-09-21 ENCOUNTER — Other Ambulatory Visit (HOSPITAL_COMMUNITY): Payer: Self-pay

## 2022-09-22 ENCOUNTER — Other Ambulatory Visit: Payer: Self-pay | Admitting: Neurological Surgery

## 2022-09-22 DIAGNOSIS — M419 Scoliosis, unspecified: Secondary | ICD-10-CM

## 2022-10-15 ENCOUNTER — Ambulatory Visit
Admission: RE | Admit: 2022-10-15 | Discharge: 2022-10-15 | Disposition: A | Discharge status: Home / Self Care | Payer: Federal, State, Local not specified - PPO | Source: Ambulatory Visit | Attending: Neurological Surgery | Admitting: Neurological Surgery

## 2022-10-15 DIAGNOSIS — M419 Scoliosis, unspecified: Secondary | ICD-10-CM

## 2022-10-24 ENCOUNTER — Other Ambulatory Visit (HOSPITAL_COMMUNITY): Payer: Self-pay

## 2022-11-02 ENCOUNTER — Other Ambulatory Visit: Payer: Self-pay

## 2022-11-21 ENCOUNTER — Other Ambulatory Visit (HOSPITAL_COMMUNITY): Payer: Self-pay

## 2023-01-02 ENCOUNTER — Other Ambulatory Visit (HOSPITAL_COMMUNITY): Payer: Self-pay

## 2023-01-02 MED ORDER — OZEMPIC (2 MG/DOSE) 8 MG/3ML ~~LOC~~ SOPN
2.0000 mg | PEN_INJECTOR | SUBCUTANEOUS | 5 refills | Status: DC
  Filled 2023-01-02: qty 3, 28d supply, fill #0
  Filled 2023-01-30: qty 3, 28d supply, fill #1
  Filled 2023-03-06: qty 3, 28d supply, fill #2
  Filled 2023-04-03: qty 3, 28d supply, fill #3
  Filled 2023-04-30: qty 3, 28d supply, fill #4
  Filled 2023-05-30: qty 3, 28d supply, fill #5

## 2023-01-04 ENCOUNTER — Other Ambulatory Visit (HOSPITAL_COMMUNITY): Payer: Self-pay

## 2023-01-30 ENCOUNTER — Other Ambulatory Visit (HOSPITAL_COMMUNITY): Payer: Self-pay

## 2023-02-14 ENCOUNTER — Other Ambulatory Visit: Payer: Self-pay

## 2023-02-14 DIAGNOSIS — E1169 Type 2 diabetes mellitus with other specified complication: Secondary | ICD-10-CM | POA: Insufficient documentation

## 2023-02-14 DIAGNOSIS — M25561 Pain in right knee: Secondary | ICD-10-CM | POA: Insufficient documentation

## 2023-02-14 DIAGNOSIS — I10 Essential (primary) hypertension: Secondary | ICD-10-CM | POA: Insufficient documentation

## 2023-02-14 DIAGNOSIS — K5792 Diverticulitis of intestine, part unspecified, without perforation or abscess without bleeding: Secondary | ICD-10-CM | POA: Insufficient documentation

## 2023-02-14 DIAGNOSIS — R011 Cardiac murmur, unspecified: Secondary | ICD-10-CM | POA: Insufficient documentation

## 2023-02-14 DIAGNOSIS — Z889 Allergy status to unspecified drugs, medicaments and biological substances status: Secondary | ICD-10-CM | POA: Insufficient documentation

## 2023-02-14 DIAGNOSIS — Z01810 Encounter for preprocedural cardiovascular examination: Secondary | ICD-10-CM | POA: Insufficient documentation

## 2023-02-14 DIAGNOSIS — G8929 Other chronic pain: Secondary | ICD-10-CM | POA: Insufficient documentation

## 2023-02-14 DIAGNOSIS — Z87442 Personal history of urinary calculi: Secondary | ICD-10-CM | POA: Insufficient documentation

## 2023-02-14 DIAGNOSIS — E785 Hyperlipidemia, unspecified: Secondary | ICD-10-CM | POA: Insufficient documentation

## 2023-02-14 DIAGNOSIS — M1990 Unspecified osteoarthritis, unspecified site: Secondary | ICD-10-CM | POA: Insufficient documentation

## 2023-02-14 DIAGNOSIS — G2581 Restless legs syndrome: Secondary | ICD-10-CM | POA: Insufficient documentation

## 2023-02-14 DIAGNOSIS — I700 Atherosclerosis of aorta: Secondary | ICD-10-CM | POA: Insufficient documentation

## 2023-02-14 DIAGNOSIS — M419 Scoliosis, unspecified: Secondary | ICD-10-CM | POA: Insufficient documentation

## 2023-02-14 DIAGNOSIS — M4807 Spinal stenosis, lumbosacral region: Secondary | ICD-10-CM | POA: Insufficient documentation

## 2023-02-14 DIAGNOSIS — K141 Geographic tongue: Secondary | ICD-10-CM | POA: Insufficient documentation

## 2023-02-14 DIAGNOSIS — M25562 Pain in left knee: Secondary | ICD-10-CM | POA: Insufficient documentation

## 2023-02-15 ENCOUNTER — Encounter: Payer: Self-pay | Admitting: Cardiology

## 2023-02-15 ENCOUNTER — Ambulatory Visit: Payer: Federal, State, Local not specified - PPO | Attending: Cardiology | Admitting: Cardiology

## 2023-02-15 VITALS — BP 130/70 | HR 73 | Ht 67.0 in | Wt 258.4 lb

## 2023-02-15 DIAGNOSIS — E782 Mixed hyperlipidemia: Secondary | ICD-10-CM

## 2023-02-15 DIAGNOSIS — Z01810 Encounter for preprocedural cardiovascular examination: Secondary | ICD-10-CM

## 2023-02-15 DIAGNOSIS — E1169 Type 2 diabetes mellitus with other specified complication: Secondary | ICD-10-CM

## 2023-02-15 DIAGNOSIS — I700 Atherosclerosis of aorta: Secondary | ICD-10-CM | POA: Diagnosis not present

## 2023-02-15 DIAGNOSIS — I10 Essential (primary) hypertension: Secondary | ICD-10-CM | POA: Diagnosis not present

## 2023-02-15 NOTE — Progress Notes (Signed)
 Cardiology Office Note:    Date:  02/15/2023   ID:  Victor [name] Mar 13, 1940, MRN [id number]  PCP:  Victor Paradise, MD  Cardiologist:  Victor Brothers, MD   Referring MD: Victor Paradise, MD    ASSESSMENT:    1. Preop cardiovascular exam   2. Atherosclerosis of aorta (HCC)   3. Primary hypertension   4. Type 2 diabetes mellitus with other specified complication, without long-term current use of insulin (HCC)   5. Mixed hyperlipidemia   6. 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  Importance of compliance with diet medication stressed and patient verbalized standing. Preoperative cardiovascular assessment: I discussed with patient my findings.  He has multiple risk factors for coronary artery disease.  In view of this we will do a Lexiscan sestamibi.  If this is negative then he is not at high risk for coronary events during the aforementioned surgery.  Meticulous hemodynamic monitoring will reduce risks of monitoring. Essential hypertension: Blood pressure stable and diet was emphasized. Cardiac murmur: Echocardiogram will be done to assess murmur heard on auscultation. Diabetes mellitus and morbid obesity: Not under the best of control and I discussed this with him at length.  Weight reduction stressed diet emphasized.  Risks of obesity explained and he promises to do better. Patient will be seen in follow-up appointment in 6 months or earlier if the patient has any concerns.    Medication Adjustments/Labs and Tests Ordered: Current medicines are reviewed at length with the patient today.  Concerns regarding medicines are outlined above.  Orders Placed This Encounter  Procedures   MYOCARDIAL PERFUSION IMAGING   EKG 12-Lead   ECHOCARDIOGRAM COMPLETE   No orders of the defined types were placed in this encounter.    History of Present Illness:    Victor [name] is a 83 y.o. male who is being seen today for the evaluation of preop cardiovascular assessment for knee replacement surgery at the request of Victor Paradise, MD. patient is a pleasant 83 year old man.  He has past medical history of essential hypertension, dyslipidemia, diabetes mellitus and morbid obesity.  He leads a sedentary lifestyle.  He mentions to me that he is going to undergo knee surgery.  Again he has no symptoms but because of metastatic disease been sent with evaluation.  At the time of my evaluation, the patient is alert awake oriented and in no distress.  He denies any chest pain orthopnea or PND.  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   Scoliosis    "adult"   Stenosis of lumbosacral spine    Type 2 diabetes mellitus with other specified complication [facility])     Past Surgical History:  Procedure Laterality Date   APPENDECTOMY  as child  APPENDECTOMY N/A 08/12/2012   Procedure: APPENDECTOMY;  Surgeon: Velora Heckler, MD;  Location: [facility] ORS;  Service: General;  Laterality: N/A;   CHOLECYSTECTOMY     COLONOSCOPY     many   cyst removed Left    wrist   CYSTOSCOPY  40 years ago   KNEE ARTHROSCOPY Left    PARTIAL COLECTOMY N/A 08/12/2012   Procedure:  SIGMOID COLECTOMY;  Surgeon: Velora Heckler, MD;  Location: [facility] ORS;  Service: General;  Laterality: N/A;   ROTATOR CUFF REPAIR     bilateral    SHOULDER SURGERY Left    TONSILLECTOMY     TOTAL KNEE ARTHROPLASTY Left 04/17/2016   TOTAL KNEE  ARTHROPLASTY Left 04/17/2016   Procedure: LEFT TOTAL KNEE ARTHROPLASTY;  Surgeon: Dannielle Huh, MD;  Location: [facility] OR;  Service: Orthopedics;  Laterality: Left;    Current Medications: Current Meds  Medication Sig   acetaminophen (TYLENOL) 500 MG tablet Take 1,000 mg by mouth every 6 (six) hours as needed for mild pain.    albuterol (VENTOLIN HFA) 108 (90 Base) MCG/ACT inhaler Inhale 2 puffs into the lungs as needed for wheezing or shortness of breath.   amLODipine (NORVASC) 5 MG tablet Take 5 mg by mouth daily.   brimonidine-timolol (COMBIGAN) 0.2-0.5 % ophthalmic solution Place 1 drop into both eyes 2 (two) times daily.   HYDROMET 5-1.5 MG/5ML syrup Take 5 mLs by mouth every 6 (six) hours as needed for cough.   latanoprost (XALATAN) 0.005 % ophthalmic solution Place 1 drop into both eyes at bedtime.   meloxicam (MOBIC) 15 MG tablet Take 15 mg by mouth daily.   metFORMIN (GLUCOPHAGE) 500 MG tablet Take 500 mg by mouth daily with breakfast.   olmesartan (BENICAR) 40 MG tablet Take 40 mg by mouth at bedtime.   rosuvastatin (CRESTOR) 20 MG tablet Take 20 mg by mouth daily.   Semaglutide, 2 MG/DOSE, (OZEMPIC, 2 MG/DOSE,) 8 MG/3ML SOPN Inject 2 mg into the skin once a week.   Semaglutide, 2 MG/DOSE, (OZEMPIC, 2 MG/DOSE,) 8 MG/3ML SOPN Inject 2 mg into the skin once a week.   Semaglutide, 2 MG/DOSE, (OZEMPIC, 2 MG/DOSE,) 8 MG/3ML SOPN Inject 2 mg into the skin once a week.     Allergies:   Bee venom, Hydrocodone-acetaminophen, Oxaprozin, and Valdecoxib   Social History   Socioeconomic History   Marital status: Married    Spouse name: Not on file   Number of children: Not on file   Years of education: Not on file   Highest education level: Not on file  Occupational History   Not on file  Tobacco Use   Smoking status: Never   Smokeless tobacco: Never  Substance and Sexual Activity   Alcohol use: No   Drug use: No   Sexual activity: Not on file  Other Topics Concern   Not on file   Social History Narrative   Not on file   Social Drivers of Health   Financial Resource Strain: Not on file  Food Insecurity: Not on file  Transportation Needs: Not on file  Physical Activity: Not on file  Stress: Not on file  Social Connections: Not on file     Family History: The patient's family history includes Cancer in his father and maternal grandfather; Heart disease in his mother.  ROS:   Please see the history of present illness.    All other systems reviewed and are negative.  EKGs/Labs/Other Studies Reviewed:    The following studies were  reviewed today:  EKG Interpretation Date/Time:  Thursday February 15 2023 15:55:59 EST Ventricular Rate:  73 PR Interval:  168 QRS Duration:  90 QT Interval:  408 QTC Calculation: 449 R Axis:   45  Text Interpretation: Normal sinus rhythm Septal infarct , age undetermined Abnormal ECG When compared with ECG of 13-Oct-1998 10:27, Septal infarct is now Present ST now depressed in Inferior leads Confirmed by Belva Crome [id number]) on 02/15/2023 4:13:50 PM    Recent Labs: No results found for requested labs within last 365 days.  Recent Lipid Panel No results found for: "CHOL", "TRIG", "HDL", "CHOLHDL", "VLDL", "LDLCALC", "LDLDIRECT"  Physical Exam:    VS:  BP 130/70   Pulse 73   Ht 5\' 7"  (1.702 m)   Wt 258 lb 6.4 oz (117.2 kg)   SpO2 95%   BMI 40.47 kg/m     Wt Readings from Last 3 Encounters:  02/15/23 258 lb 6.4 oz (117.2 kg)  04/07/16 262 lb 12.8 oz (119.2 kg)  10/14/12 237 lb 6.4 oz (107.7 kg)     GEN: Patient is in no acute distress HEENT: Normal NECK: No JVD; No carotid bruits LYMPHATICS: No lymphadenopathy CARDIAC: S1 S2 regular, 2/6 systolic murmur at the apex. RESPIRATORY:  Clear to auscultation without rales, wheezing or rhonchi  ABDOMEN: Soft, non-tender, non-distended MUSCULOSKELETAL:  No edema; No deformity  SKIN: Warm and dry NEUROLOGIC:  Alert and oriented x 3 PSYCHIATRIC:  Normal affect     Signed, Victor Brothers, MD  02/15/2023 4:13 PM    [facility]

## 2023-02-15 NOTE — Patient Instructions (Signed)
Medication Instructions:  Your physician recommends that you continue on your current medications as directed. Please refer to the Current Medication list given to you today.   *If you need a refill on your cardiac medications before your next appointment, please call your pharmacy*   Lab Work: None ordered If you have labs (blood work) drawn today and your tests are completely normal, you will receive your results only by: MyChart Message (if you have MyChart) OR A paper copy in the mail If you have any lab test that is abnormal or we need to change your treatment, we will call you to review the results.   Testing/Procedures:   [facility] Cardiovascular Imaging at [facility] [address], [address] [place], [place] [address] Phone: [phone]    Please arrive 15 minutes prior to your appointment time for registration and insurance purposes.  The test will take approximately 3 to 4 hours to complete; you may bring reading material.  If someone comes with you to your appointment, they will need to remain in the main lobby due to limited space in the testing area. **If you are pregnant or breastfeeding, please notify the nuclear lab prior to your appointment**  How to prepare for your Myocardial Perfusion Test: Do not eat or drink 3 hours prior to your test, except you may have water. Do not consume products containing caffeine (regular or decaffeinated) 12 hours prior to your test. (ex: coffee, chocolate, sodas, tea). Do bring a list of your current medications with you.  If not listed below, you may take your medications as normal. Do wear comfortable clothes (no dresses or overalls) and walking shoes, tennis shoes preferred (No heels or open toe shoes are allowed). Do NOT wear cologne, perfume, aftershave, or lotions (deodorant is allowed). If these instructions are not followed, your test will have to be rescheduled.  If you cannot keep your appointment, please  provide 24 hours notification to the Nuclear Lab, to avoid a possible $50 charge to your account. Please report to [address], [address] for your test.  If you have questions or concerns about your appointment, you can call the Nuclear Lab at [phone].   If you cannot keep your appointment, please provide 24 hours notification to the Nuclear Lab, to avoid a possible $50 charge to your account.    Your physician has requested that you have an echocardiogram. Echocardiography is a painless test that uses sound waves to create images of your heart. It provides your doctor with information about the size and shape of your heart and how well your heart's chambers and valves are working. This procedure takes approximately one hour. There are no restrictions for this procedure. Please do NOT wear cologne, perfume, aftershave, or lotions (deodorant is allowed). Please arrive 15 minutes prior to your appointment time.  Please note: We ask at that you not bring children with you during ultrasound (echo/ vascular) testing. Due to room size and safety concerns, children are not allowed in the ultrasound rooms during exams. Our front office staff cannot provide observation of children in our lobby area while testing is being conducted. An adult accompanying a patient to their appointment will only be allowed in the ultrasound room at the discretion of the ultrasound technician under special circumstances. We apologize for any inconvenience.    Follow-Up: At [facility], you and your health needs are our priority.  As part of our continuing mission to provide you with exceptional heart  care, we have created designated Provider Care Teams.  These Care Teams include your primary Cardiologist (physician) and Advanced Practice Providers (APPs -  Physician Assistants and Nurse Practitioners) who all work together to provide you with the care you need, when you need it.  We recommend signing up  for the patient portal called "MyChart".  Sign up information is provided on this After Visit Summary.  MyChart is used to connect with patients for Virtual Visits (Telemedicine).  Patients are able to view lab/test results, encounter notes, upcoming appointments, etc.  Non-urgent messages can be sent to your provider as well.   To learn more about what you can do with MyChart, go to [email or web address].    Your next appointment:   9 month(s)  Provider:   Belva Crome, MD   Other Instructions  Cardiac Nuclear Scan A cardiac nuclear scan is a test that is done to check the flow of blood to your heart. It is done when you are resting and when you are exercising. The test looks for problems such as: Not enough blood reaching a portion of the heart. The heart muscle not working as it should. You may need this test if you have: Heart disease. Lab results that are not normal. Had heart surgery or a balloon procedure to open up blocked arteries (angioplasty) or a small mesh tube (stent). Chest pain. Shortness of breath. Had a heart attack. In this test, a special dye (tracer) is put into your bloodstream. The tracer will travel to your heart. A camera will then take pictures of your heart to see how the tracer moves through your heart. This test is usually done at a hospital and takes 2-4 hours. Tell a doctor about: Any allergies you have. All medicines you are taking, including vitamins, herbs, eye drops, creams, and over-the-counter medicines. Any bleeding problems you have. Any surgeries you have had. Any medical conditions you have. Whether you are pregnant or may be pregnant. Any history of asthma or long-term (chronic) lung disease. Any history of heart rhythm disorders or heart valve conditions. What are the risks? Your doctor will talk with you about risks. These may include: Serious chest pain and heart attack. This is only a risk if the stress portion of the test is  done. Fast or uneven heartbeats (palpitations). A feeling of warmth in your chest. This feeling usually does not last long. Allergic reaction to the tracer. Shortness of breath or trouble breathing. What happens before the test? Ask your doctor about changing or stopping your normal medicines. Follow instructions from your doctor about what you cannot eat or drink. Remove your jewelry on the day of the test. Ask your doctor if you need to avoid nicotine or caffeine. What happens during the test? An IV tube will be inserted into one of your veins. Your doctor will give you a small amount of tracer through the IV tube. You will wait for 20-40 minutes while the tracer moves through your bloodstream. Your heart will be monitored with an electrocardiogram (ECG). You will lie down on an exam table. Pictures of your heart will be taken for about 15-20 minutes. You may also have a stress test. For this test, one of these things may be done: You will be asked to exercise on a treadmill or a stationary bike. You will be given medicines that will make your heart work harder. This is done if you are unable to exercise. When blood flow to your heart has peaked, a  tracer will again be given through the IV tube. After 20-40 minutes, you will get back on the exam table. More pictures will be taken of your heart. Depending on the tracer that is used, more pictures may need to be taken 3-4 hours later. Your IV tube will be removed when the test is over. The test may vary among doctors and hospitals. What happens after the test? Ask your doctor: Whether you can return to your normal schedule, including diet, activities, travel, and medicines. Whether you should drink more fluids. This will help to remove the tracer from your body. Ask your doctor, or the department that is doing the test: When will my results be ready? How will I get my results? What are my treatment options? What other tests do I  need? What are my next steps? This information is not intended to replace advice given to you by your health care provider. Make sure you discuss any questions you have with your health care provider. Document Revised: 05/17/2021 Document Reviewed: 05/17/2021 Elsevier Patient Education  2023 Elsevier Inc.  Echocardiogram An echocardiogram is a test that uses sound waves (ultrasound) to produce images of the heart. Images from an echocardiogram can provide important information about: Heart size and shape. The size and thickness and movement of your heart's walls. Heart muscle function and strength. Heart valve function or if you have stenosis. Stenosis is when the heart valves are too narrow. If blood is flowing backward through the heart valves (regurgitation). A tumor or infectious growth around the heart valves. Areas of heart muscle that are not working well because of poor blood flow or injury from a heart attack. Aneurysm detection. An aneurysm is a weak or damaged part of an artery wall. The wall bulges out from the normal force of blood pumping through the body. Tell a health care provider about: Any allergies you have. All medicines you are taking, including vitamins, herbs, eye drops, creams, and over-the-counter medicines. Any blood disorders you have. Any surgeries you have had. Any medical conditions you have. Whether you are pregnant or may be pregnant. What are the risks? Generally, this is a safe test. However, problems may occur, including an allergic reaction to dye (contrast) that may be used during the test. What happens before the test? No specific preparation is needed. You may eat and drink normally. What happens during the test?  You will take off your clothes from the waist up and put on a hospital gown. Electrodes or electrocardiogram (ECG)patches may be placed on your chest. The electrodes or patches are then connected to a device that monitors your heart  rate and rhythm. You will lie down on a table for an ultrasound exam. A gel will be applied to your chest to help sound waves pass through your skin. A handheld device, called a transducer, will be pressed against your chest and moved over your heart. The transducer produces sound waves that travel to your heart and bounce back (or "echo" back) to the transducer. These sound waves will be captured in real-time and changed into images of your heart that can be viewed on a video monitor. The images will be recorded on a computer and reviewed by your health care provider. You may be asked to change positions or hold your breath for a short time. This makes it easier to get different views or better views of your heart. In some cases, you may receive contrast through an IV in one of your veins.  This can improve the quality of the pictures from your heart. The procedure may vary among health care providers and hospitals. What can I expect after the test? You may return to your normal, everyday life, including diet, activities, and medicines, unless your health care provider tells you not to do that. Follow these instructions at home: It is up to you to get the results of your test. Ask your health care provider, or the department that is doing the test, when your results will be ready. Keep all follow-up visits. This is important. Summary An echocardiogram is a test that uses sound waves (ultrasound) to produce images of the heart. Images from an echocardiogram can provide important information about the size and shape of your heart, heart muscle function, heart valve function, and other possible heart problems. You do not need to do anything to prepare before this test. You may eat and drink normally. After the echocardiogram is completed, you may return to your normal, everyday life, unless your health care provider tells you not to do that. This information is not intended to replace advice given to  you by your health care provider. Make sure you discuss any questions you have with your health care provider. Document Revised: 09/01/2020 Document Reviewed: 08/12/2019 Elsevier Patient Education  2023 Elsevier Inc.    Important Information About Sugar

## 2023-03-06 ENCOUNTER — Other Ambulatory Visit (HOSPITAL_COMMUNITY): Payer: Self-pay

## 2023-03-06 ENCOUNTER — Telehealth (HOSPITAL_COMMUNITY): Payer: Self-pay | Admitting: *Deleted

## 2023-03-06 NOTE — Telephone Encounter (Signed)
 Gave pt instructions for MPI study.

## 2023-03-15 ENCOUNTER — Ambulatory Visit (HOSPITAL_BASED_OUTPATIENT_CLINIC_OR_DEPARTMENT_OTHER): Payer: Federal, State, Local not specified - PPO

## 2023-03-15 ENCOUNTER — Ambulatory Visit (HOSPITAL_COMMUNITY): Payer: Federal, State, Local not specified - PPO | Attending: Internal Medicine

## 2023-03-15 DIAGNOSIS — Z01810 Encounter for preprocedural cardiovascular examination: Secondary | ICD-10-CM | POA: Diagnosis present

## 2023-03-15 LAB — MYOCARDIAL PERFUSION IMAGING
LV dias vol: 81 mL (ref 62–150)
LV sys vol: 31 mL
Nuc Stress EF: 62 %
Peak HR: 83 {beats}/min
Rest HR: 65 {beats}/min
Rest Nuclear Isotope Dose: 11 mCi
SDS: 1
SRS: 0
SSS: 1
ST Depression (mm): 0 mm
Stress Nuclear Isotope Dose: 31.2 mCi
TID: 0.95

## 2023-03-15 LAB — ECHOCARDIOGRAM COMPLETE
Area-P 1/2: 3.21 cm2
S' Lateral: 3.4 cm
Weight: 4128 [oz_av]

## 2023-03-15 MED ORDER — TECHNETIUM TC 99M TETROFOSMIN IV KIT
31.2000 | PACK | Freq: Once | INTRAVENOUS | Status: AC | PRN
  Administered 2023-03-15: 31.2 via INTRAVENOUS

## 2023-03-15 MED ORDER — PERFLUTREN LIPID MICROSPHERE
1.0000 mL | INTRAVENOUS | Status: AC | PRN
  Administered 2023-03-15: 2 mL via INTRAVENOUS

## 2023-03-15 MED ORDER — TECHNETIUM TC 99M TETROFOSMIN IV KIT
11.0000 | PACK | Freq: Once | INTRAVENOUS | Status: AC | PRN
  Administered 2023-03-15: 11 via INTRAVENOUS

## 2023-03-15 MED ORDER — REGADENOSON 0.4 MG/5ML IV SOLN
0.4000 mg | Freq: Once | INTRAVENOUS | Status: AC
  Administered 2023-03-15: 0.4 mg via INTRAVENOUS

## 2023-03-23 ENCOUNTER — Telehealth: Payer: Self-pay

## 2023-03-23 DIAGNOSIS — Q2112 Patent foramen ovale: Secondary | ICD-10-CM

## 2023-03-23 DIAGNOSIS — Z01810 Encounter for preprocedural cardiovascular examination: Secondary | ICD-10-CM

## 2023-03-23 DIAGNOSIS — I712 Thoracic aortic aneurysm, without rupture, unspecified: Secondary | ICD-10-CM

## 2023-03-23 NOTE — Telephone Encounter (Signed)
-----   Message from Aundra Dubin Revankar sent at 03/15/2023  4:11 PM EDT ----- Overall echo is unremarkable.  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  Please lets do a CT without contrast.  Also please set him up for bubble study.  I am not sure why they did not do it at that time.  He should be scheduled at the same place where he had the echo.  Copy primary Garwin Brothers, MD 03/15/2023 4:11 PM

## 2023-04-03 ENCOUNTER — Other Ambulatory Visit (HOSPITAL_COMMUNITY): Payer: Self-pay

## 2023-04-12 ENCOUNTER — Telehealth: Payer: Self-pay | Admitting: Cardiology

## 2023-04-12 NOTE — Telephone Encounter (Signed)
 Cheryl with Dr. Viviann Spare Lucy's office is following up requesting updates on clearance. Patient was seen on 3/13, but they haven't heard back yet. Please advise.

## 2023-04-12 NOTE — Telephone Encounter (Signed)
 Letter sent requesting clearance

## 2023-04-12 NOTE — Telephone Encounter (Addendum)
 Please contact provider's office and have a formal clearance sent over to complete clearance request.

## 2023-04-13 NOTE — Telephone Encounter (Signed)
   Name: Victor [name]  DOB: 08-28-1940  MRN: [id number]  Primary Cardiologist: Garwin Brothers, MD   Preoperative team, please contact this patient and set up a phone call appointment for further preoperative risk assessment. Please obtain consent and complete medication review. Thank you for your help.  I confirm that guidance regarding antiplatelet and oral anticoagulation therapy has been completed and, if necessary, noted below.  I also confirmed the patient resides in the state of [place]. As per [facility] Medical Board telemedicine laws, the patient must reside in the state in which the provider is licensed.   Marcelino Duster, PA 04/13/2023, 5:02 PM [facility]

## 2023-04-13 NOTE — Telephone Encounter (Signed)
   Pre-operative Risk Assessment    Patient Name: Victor [name]  DOB: Jul 27, 1940 MRN: [id number]   Date of last office visit: 02/15/23 Date of next office visit: N/A   Request for Surgical Clearance    Procedure:   Rt TKA  Date of Surgery:  Clearance TBD                                Surgeon:  Dr. Georgena Spurling Surgeon's Group or Practice Name:  [facility] Phone number:  [phone] Fax number:  [phone]   Type of Clearance Requested:   - Medical    Type of Anesthesia:  Spinal   Additional requests/questions:    SignedDione Housekeeper   04/13/2023, 4:29 PM

## 2023-04-16 NOTE — Telephone Encounter (Signed)
 Spoke with patient who states that his surgery is on hold as of now and he will call us  back when he is ready to schedule for pre-clearance.

## 2023-04-30 ENCOUNTER — Other Ambulatory Visit (HOSPITAL_COMMUNITY): Payer: Self-pay

## 2023-05-30 ENCOUNTER — Other Ambulatory Visit (HOSPITAL_COMMUNITY): Payer: Self-pay

## 2023-06-04 ENCOUNTER — Telehealth: Payer: Self-pay | Admitting: *Deleted

## 2023-06-04 ENCOUNTER — Telehealth: Payer: Self-pay | Admitting: Cardiology

## 2023-06-04 NOTE — Telephone Encounter (Signed)
 S/w the pt and his wife and pt said he is ready to schedule his tele preop appt as he is looking at having surgery somewhere between August and Sept 2025. I did explain to the pt that I cannot schedule a tele appt that will be more than 2 months old from the surgery date. Pt agreed to schedule tele preop appt end of July or 1st week of August 2025.   Pt chose tele appt 07/30/23.   Med rec and consent are done.

## 2023-06-04 NOTE — Telephone Encounter (Signed)
 Patient stated he wants to have surgery done in August/ September and is now following-up to get cleared for the surgery

## 2023-06-04 NOTE — Telephone Encounter (Signed)
  Patient Consent for Virtual Visit        PHILOPATER MUCHA has provided verbal consent on 06/04/2023 for a virtual visit (video or telephone).   CONSENT FOR VIRTUAL VISIT FOR:  Eudora Heron  By participating in this virtual visit I agree to the following:  I hereby voluntarily request, consent and authorize [facility] and its employed or contracted physicians, physician assistants, nurse practitioners or other licensed health care professionals (the Practitioner), to provide me with telemedicine health care services (the "Services") as deemed necessary by the treating Practitioner. I acknowledge and consent to receive the Services by the Practitioner via telemedicine. I understand that the telemedicine visit will involve communicating with the Practitioner through live audiovisual communication technology and the disclosure of certain medical information by electronic transmission. I acknowledge that I have been given the opportunity to request an in-person assessment or other available alternative prior to the telemedicine visit and am voluntarily participating in the telemedicine visit.  I understand that I have the right to withhold or withdraw my consent to the use of telemedicine in the course of my care at any time, without affecting my right to future care or treatment, and that the Practitioner or I may terminate the telemedicine visit at any time. I understand that I have the right to inspect all information obtained and/or recorded in the course of the telemedicine visit and may receive copies of available information for a reasonable fee.  I understand that some of the potential risks of receiving the Services via telemedicine include:  Delay or interruption in medical evaluation due to technological equipment failure or disruption; Information transmitted may not be sufficient (e.g. poor resolution of images) to allow for appropriate medical decision making by the  Practitioner; and/or  In rare instances, security protocols could fail, causing a breach of personal health information.  Furthermore, I acknowledge that it is my responsibility to provide information about my medical history, conditions and care that is complete and accurate to the best of my ability. I acknowledge that Practitioner's advice, recommendations, and/or decision may be based on factors not within their control, such as incomplete or inaccurate data provided by me or distortions of diagnostic images or specimens that may result from electronic transmissions. I understand that the practice of medicine is not an exact science and that Practitioner makes no warranties or guarantees regarding treatment outcomes. I acknowledge that a copy of this consent can be made available to me via my patient portal [facility] MyChart), or I can request a printed copy by calling the office of [facility].    I understand that my insurance will be billed for this visit.   I have read or had this consent read to me. I understand the contents of this consent, which adequately explains the benefits and risks of the Services being provided via telemedicine.  I have been provided ample opportunity to ask questions regarding this consent and the Services and have had my questions answered to my satisfaction. I give my informed consent for the services to be provided through the use of telemedicine in my medical care

## 2023-06-14 NOTE — Progress Notes (Signed)
 Shawnee is in today for orthopedic follow-up.  Trochanteric bursitis of both hips.  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  He is in today requesting injections in his hips.  On examination 83 year old male in no acute distress.  He is alert and oriented x 3.  Exam of the head is normocephalic atraumatic.  Neck is supple no meningismus.  Musculoskeletal exam of the right hip good internal/external rotation tenderness of the trochanteric bursa, left hip good internal/external rotation tenderness of the trochanteric bursa.  Skin is warm dry intact no signs of infection.  Impression: Trochanteric bursitis of both hips.  We discussed rest ice activity modification.  Will inject both hips today and follow-up with him as needed  Large joint arthrocentesis: bilateral greater trochanteric bursa on 06/11/2023 3:00 PMDetails: 21 G needle, lateral approach Medications (Right): 8 mL BUPivacaine  HCl 0.5 % (5 mg/mL); 80 mg triamcinolone acetonide 40 mg/mL Medications (Left): 8 mL BUPivacaine  HCl 0.5 % (5 mg/mL); 80 mg triamcinolone acetonide 40 mg/mL Procedure, treatment alternatives, risks and benefits explained, specific risks discussed. Consent was given by the patient. Patient was prepped and draped in the usual sterile fashion.

## 2023-06-27 ENCOUNTER — Other Ambulatory Visit (HOSPITAL_COMMUNITY): Payer: Self-pay

## 2023-06-27 MED ORDER — OZEMPIC (2 MG/DOSE) 8 MG/3ML ~~LOC~~ SOPN
2.0000 mg | PEN_INJECTOR | SUBCUTANEOUS | 5 refills | Status: DC
  Filled 2023-06-27: qty 3, 28d supply, fill #0
  Filled 2023-08-09: qty 3, 28d supply, fill #1
  Filled 2023-09-11: qty 3, 28d supply, fill #2
  Filled 2023-10-10: qty 3, 28d supply, fill #3
  Filled 2023-11-07: qty 3, 28d supply, fill #4
  Filled 2023-12-05: qty 3, 28d supply, fill #5

## 2023-07-30 ENCOUNTER — Ambulatory Visit: Attending: Internal Medicine | Admitting: Student

## 2023-07-30 NOTE — Progress Notes (Unsigned)
 Virtual Visit via Telephone Note   Because of Victor [name]'s co-morbid illnesses, he is at least at moderate risk for complications without adequate follow up.  This format is felt to be most appropriate for this patient at this time.  The patient did not have access to video technology/had technical difficulties with video requiring transitioning to audio format only (telephone).  All issues noted in this document were discussed and addressed.  No physical exam could be performed with this format.  Please refer to the patient's chart for his consent to telehealth for [facility].  Evaluation Performed:  Preoperative cardiovascular risk assessment _____________   Date:  07/30/2023   Patient ID:  Victor [name], DOB June 26, 1940, MRN [id number] Patient Location:  Home Provider location:   Office  Primary Care Provider:  Shepard Ade, [name] Primary Cardiologist:  Jennifer JONELLE Crape, [name]  Chief Complaint / Patient Profile   83 y.o. y/o male with a h/o aortic atherosclerosis, mild dilatation of ascending aorta, low risk nuclear stress test March 2025, hypertension, hyperlipidemia, T2DM who is pending right TKA by Dr. Rubie and presents today for telephonic preoperative cardiovascular risk assessment.  History of Present Illness    Victor [name] is a 83 y.o. male who presents via audio/video conferencing for a telehealth visit today.  Pt was last seen in cardiology clinic on 02/15/2023 by Dr. Crape.  At that time Victor [name] was stable from a cardiac standpoint.  Given his multiple risk factors a Lexiscan  was ordered for further risk stratification which was performed in March 2025 and showed no ischemia.  The patient is now pending procedure as outlined above. Since his last visit, he is doing well. Patient denies shortness of breath, dyspnea on exertion, lower extremity edema, orthopnea or PND. No chest pain, pressure, or tightness. No palpitations.  His activity is  very limited secondary to arthritis in his back, hips and knees. He is not sure if he wants to go through with this knee surgery, as he is having difficulty with his left knee which had previously been replaced. 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   Scoliosis    adult   Stenosis of lumbosacral spine    Type 2 diabetes mellitus with other specified complication [facility])    Past Surgical History:  Procedure Laterality Date   APPENDECTOMY  as child   APPENDECTOMY N/A 08/12/2012   Procedure: APPENDECTOMY;  Surgeon: Krystal CHRISTELLA Spinner, [name];  Location: [facility];  Service: General;  Laterality: N/A;   CHOLECYSTECTOMY     COLONOSCOPY     many   cyst removed Left    wrist   CYSTOSCOPY  40 years ago   KNEE ARTHROSCOPY Left    PARTIAL COLECTOMY N/A 08/12/2012   Procedure:  SIGMOID COLECTOMY;  Surgeon: Krystal CHRISTELLA Spinner, [name];  Location: [facility];  Service: General;  Laterality: N/A;   ROTATOR CUFF REPAIR     bilateral    SHOULDER SURGERY Left    TONSILLECTOMY     TOTAL KNEE ARTHROPLASTY Left 04/17/2016   TOTAL KNEE ARTHROPLASTY Left 04/17/2016   Procedure: LEFT TOTAL KNEE ARTHROPLASTY;  Surgeon: Marcey Raman, [name];  Location: [facility] OR;  Service: Orthopedics;  Laterality: Left;    Allergies  Allergies  Allergen Reactions   Bee Venom Palpitations    States becomes short of breath and has palpitations   Hydrocodone -Acetaminophen  Itching   Oxaprozin Rash   Valdecoxib Rash    Home Medications     Prior to Admission medications   Medication Sig Start Date End Date Taking? Authorizing Provider  acetaminophen  (TYLENOL ) 500 MG tablet Take 1,000 mg by mouth every 6 (six) hours as needed for mild pain.     Provider, Historical, [name]  albuterol (VENTOLIN HFA) 108 (90 Base) MCG/ACT inhaler Inhale 2 puffs into the lungs as needed for wheezing or shortness of breath. 01/10/21   Provider, Historical, [name]  amLODipine (NORVASC) 5 MG tablet Take 5 mg by mouth daily.    Provider, Historical, [name]  brimonidine -timolol  (COMBIGAN ) 0.2-0.5 % ophthalmic solution Place 1 drop into both eyes 2 (two) times daily.    Provider, Historical, [name]  HYDROMET 5-1.5 MG/5ML syrup Take 5 mLs by mouth every 6 (six) hours as needed for cough. 01/24/23   Provider, Historical, [name]  latanoprost  (XALATAN ) 0.005 % ophthalmic solution Place 1 drop into both eyes at bedtime. 04/25/12   Provider, Historical, [name]  meloxicam (MOBIC) 15 MG tablet Take 15 mg by mouth daily.    Provider, Historical, [name]  metFORMIN (GLUCOPHAGE) 500 MG tablet Take 500 mg by mouth daily with breakfast.    Provider, Historical, [name]  olmesartan (BENICAR) 40 MG tablet Take 40 mg by mouth at bedtime.    Provider, Historical, [name]  rosuvastatin (CRESTOR) 20 MG tablet Take 20 mg by mouth daily.    Provider, Historical, [name]  Semaglutide , 2 MG/DOSE, (OZEMPIC , 2 MG/DOSE,) 8 MG/3ML SOPN Inject 2 mg into the skin once a week. 05/17/21     Semaglutide , 2 MG/DOSE, (OZEMPIC , 2 MG/DOSE,) 8 MG/3ML SOPN Inject 2 mg into the skin once a week. 05/17/21     Semaglutide , 2 MG/DOSE, (OZEMPIC , 2 MG/DOSE,) 8 MG/3ML SOPN Inject 2 mg into the skin once a week. 06/27/23       Physical Exam    Vital Signs:  Victor [name] does not have vital signs available for review today.  Given telephonic nature of communication, physical exam is limited. AAOx3. NAD. Normal affect.  Speech and respirations are unlabored.  Accessory Clinical Findings    Cardiac Studies & Procedures    ______________________________________________________________________________________________   STRESS TESTS  MYOCARDIAL PERFUSION IMAGING 03/15/2023  Interpretation Summary   Findings are consistent with no ischemia. The study is low risk.   No ST deviation was noted.   LV perfusion is abnormal. Defect 1: There is a small defect with mild reduction in uptake present in the apical apex location(s) that is partially reversible. There is normal wall motion in the defect area. Consistent with artifact.   Left ventricular function is normal. Nuclear stress EF: 62%. The left ventricular ejection fraction is normal (55-65%). End diastolic cavity size is mildly enlarged. End systolic cavity size is normal.   Prior study not available for comparison.  Small size, mild intensity mostly fixed (SDS 1) apical perfusion defect, likely artifact. No significant  reversible ischemia. LVEF 62% with normal wall motion. This is a low risk study. No prior for comparison.   ECHOCARDIOGRAM  ECHOCARDIOGRAM COMPLETE 03/15/2023  Narrative ECHOCARDIOGRAM REPORT    Patient Name:   Victor [name] Date of Exam: 03/15/2023 Medical Rec #:  [id number]         Height:       67.0 in Accession #:    [id number]        Weight:       258.0 lb Date of Birth:  Feb 23, 1940        BSA:          2.253 m Patient Age:    82 years          BP:           130/70 mmHg Patient Gender: M                 HR:           71 bpm. Exam Location:  [address]  Procedure: 2D Echo and Intracardiac Opacification Agent (Both Spectral and Color Flow Doppler were utilized during procedure).  Indications:    Z01.818 Pre-operative cardiovascular examination  History:        Patient has no prior history of Echocardiogram examinations. Signs/Symptoms:Murmur; Risk Factors:Hypertension, Diabetes and Dyslipidemia. Atherosclerosis of aorta. Morbid obesity.  Sonographer:    Jon Hacker RCS Referring Phys: CYRUS JENNIFER SAUNDERS  [facility]  IMPRESSIONS   1. Left ventricular ejection fraction, by estimation, is 50 to 55%. The left ventricle has low normal function. The left ventricle demonstrates regional wall motion abnormalities (see scoring diagram/findings for description). Left ventricular diastolic parameters are consistent with Grade I diastolic dysfunction (impaired relaxation). 2. Right ventricular systolic function is normal. The right ventricular size is mildly enlarged. There is normal pulmonary artery systolic pressure. The estimated right ventricular systolic pressure is 32.6 mmHg. 3. Concerns for a PFO with L to R shunting, recommend TTE with bubble or TEE for clarification. 4. The mitral valve is grossly normal. Trivial mitral valve regurgitation. No evidence of mitral stenosis. 5. The aortic valve is tricuspid. There is mild calcification of the aortic valve. Aortic valve regurgitation is not visualized. Aortic valve sclerosis/calcification is present, without any evidence of aortic stenosis. 6. There is mild dilatation of the ascending aorta, measuring 40 mm. 7. The inferior vena cava is normal in size with greater than 50% respiratory variability, suggesting right atrial pressure of 3 mmHg.  FINDINGS Left Ventricle: Left ventricular ejection fraction, by estimation, is 50 to 55%. The left ventricle has low normal function. The left ventricle demonstrates regional wall motion abnormalities. The left ventricular internal cavity size was normal in size. There is no left ventricular hypertrophy. Left ventricular diastolic parameters are consistent with Grade I diastolic dysfunction (impaired relaxation).   LV Wall Scoring: The apical septal segment, apical anterior segment, and apex are hypokinetic.  Right Ventricle: The right ventricular size is mildly enlarged. No increase in right ventricular wall thickness. Right ventricular systolic function is normal. There is normal pulmonary artery systolic  pressure. The tricuspid regurgitant velocity is 2.72 m/s, and with an assumed right atrial pressure of 3 mmHg, the estimated right ventricular systolic pressure is 32.6 mmHg.  Left Atrium: Left atrial size was normal in size.  Right Atrium: Right atrial size was normal in size.  Pericardium: Trivial pericardial effusion is present.  Mitral Valve: The mitral valve is grossly normal. Trivial mitral valve regurgitation. No evidence of mitral valve  stenosis.  Tricuspid Valve: The tricuspid valve is grossly normal. Tricuspid valve regurgitation is mild . No evidence of tricuspid stenosis.  Aortic Valve: The aortic valve is tricuspid. There is mild calcification of the aortic valve. Aortic valve regurgitation is not visualized. Aortic valve sclerosis/calcification is present, without any evidence of aortic stenosis.  Pulmonic Valve: The pulmonic valve was grossly normal. Pulmonic valve regurgitation is not visualized. No evidence of pulmonic stenosis.  Aorta: The aortic root is normal in size and structure. There is mild dilatation of the ascending aorta, measuring 40 mm.  Venous: The inferior vena cava is normal in size with greater than 50% respiratory variability, suggesting right atrial pressure of 3 mmHg.  IAS/Shunts: The atrial septum is grossly normal.   LEFT VENTRICLE PLAX 2D LVIDd:         5.60 cm   Diastology LVIDs:         3.40 cm   LV e' medial:    5.11 cm/s LV PW:         1.20 cm   LV E/e' medial:  18.6 LV IVS:        1.30 cm   LV e' lateral:   8.27 cm/s LVOT diam:     2.00 cm   LV E/e' lateral: 11.5 LV SV:         68 LV SV Index:   30 LVOT Area:     3.14 cm   RIGHT VENTRICLE RV S prime:     13.90 cm/s TAPSE (M-mode): 2.4 cm RVSP:           32.6 mmHg  LEFT ATRIUM             Index        RIGHT ATRIUM           Index LA diam:        4.30 cm 1.91 cm/m   RA Pressure: 3.00 mmHg LA Vol (A2C):   51.5 ml 22.85 ml/m  RA Area:     8.42 cm LA Vol (A4C):   54.8 ml 24.32  ml/m  RA Volume:   13.20 ml  5.86 ml/m LA Biplane Vol: 55.9 ml 24.81 ml/m AORTIC VALVE LVOT Vmax:   103.00 cm/s LVOT Vmean:  68.900 cm/s LVOT VTI:    0.216 m  AORTA Ao Root diam: 3.50 cm Ao Asc diam:  4.00 cm  MITRAL VALVE                TRICUSPID VALVE MV Area (PHT): 3.21 cm     TR Peak grad:   29.6 mmHg MV Decel Time: 236 msec     TR Vmax:        272.00 cm/s MV E velocity: 94.90 cm/s   Estimated RAP:  3.00 mmHg MV A velocity: 118.00 cm/s  RVSP:           32.6 mmHg MV E/A ratio:  0.80 SHUNTS Systemic VTI:  0.22 m Systemic Diam: 2.00 cm  Victor [name] Electronically signed by Victor [name] Signature Date/Time: 03/15/2023/12:03:36 PM    Final          ______________________________________________________________________________________________       Assessment & Plan    Primary Cardiologist: Jennifer JONELLE Crape, [name]  Preoperative cardiovascular risk assessment.  Right TKA by Dr. Rubie.  Chart reviewed as part of pre-operative protocol coverage. According to the RCRI, patient has a 0.5% risk of MACE. Patient reports limited activity secondary to arthritis in knees, hips and back.  He underwent nuclear stress testing in March 2025 which was a low risk study.    Therefore, based on ACC/AHA guidelines, Victor [name] would be at acceptable risk for the planned procedure without further cardiovascular testing.   Patient was advised that if he develops new symptoms prior to surgery to contact our office to arrange a follow-up appointment.  he verbalized understanding.  I will route this recommendation to the requesting party via Epic fax function.  Please call with questions.  Time:   Today, I have spent 6 minutes with the patient with telehealth technology discussing medical history, symptoms, and management plan.     Barnie Hila, NP  07/30/2023, 8:13 AM

## 2023-08-09 ENCOUNTER — Other Ambulatory Visit (HOSPITAL_COMMUNITY): Payer: Self-pay

## 2023-09-11 ENCOUNTER — Other Ambulatory Visit (HOSPITAL_COMMUNITY): Payer: Self-pay

## 2023-09-12 ENCOUNTER — Telehealth: Payer: Self-pay | Admitting: Cardiology

## 2023-09-12 NOTE — Telephone Encounter (Signed)
 No VM set up.

## 2023-09-12 NOTE — Telephone Encounter (Signed)
  Patient is calling to speak with the nurse again in regards to the testing they previously discussed today

## 2023-09-13 NOTE — Telephone Encounter (Signed)
 Attempted to call the patient. Patient did not answer the phone. Unable to leave a message due to her voice mailbox not being set-up.

## 2023-09-14 NOTE — Telephone Encounter (Signed)
 Attempted to call the patient. Patient did not answer the phone. Unable to leave a message due to her voice mailbox not being set-up.

## 2023-09-17 NOTE — Telephone Encounter (Signed)
 Attempted to call the patient. Patient did not answer the phone. Unable to leave a message due to her voice mailbox not being set-up.

## 2023-10-10 ENCOUNTER — Other Ambulatory Visit (HOSPITAL_COMMUNITY): Payer: Self-pay

## 2023-11-07 ENCOUNTER — Other Ambulatory Visit (HOSPITAL_COMMUNITY): Payer: Self-pay

## 2023-12-05 ENCOUNTER — Other Ambulatory Visit (HOSPITAL_COMMUNITY): Payer: Self-pay

## 2024-01-02 ENCOUNTER — Other Ambulatory Visit (HOSPITAL_COMMUNITY): Payer: Self-pay

## 2024-01-04 ENCOUNTER — Other Ambulatory Visit (HOSPITAL_COMMUNITY): Payer: Self-pay

## 2024-01-04 MED ORDER — OZEMPIC (2 MG/DOSE) 8 MG/3ML ~~LOC~~ SOPN
2.0000 mg | PEN_INJECTOR | SUBCUTANEOUS | 5 refills | Status: AC
  Filled 2024-01-04: qty 3, 28d supply, fill #0
  Filled 2024-02-05: qty 3, 28d supply, fill #1

## 2024-02-05 ENCOUNTER — Other Ambulatory Visit (HOSPITAL_COMMUNITY): Payer: Self-pay
